# Patient Record
Sex: Male | Born: 1990
Health system: Southern US, Community
[De-identification: ages and names within clinical notes are randomized; demographics above are authoritative.]

## PROBLEM LIST (undated history)

## (undated) DIAGNOSIS — K219 Gastro-esophageal reflux disease without esophagitis: Secondary | ICD-10-CM

## (undated) DIAGNOSIS — T7840XA Allergy, unspecified, initial encounter: Secondary | ICD-10-CM

## (undated) DIAGNOSIS — J45909 Unspecified asthma, uncomplicated: Secondary | ICD-10-CM

## (undated) DIAGNOSIS — M1990 Unspecified osteoarthritis, unspecified site: Secondary | ICD-10-CM

## (undated) HISTORY — DX: Unspecified asthma, uncomplicated: J45.909

## (undated) HISTORY — DX: Unspecified osteoarthritis, unspecified site: M19.90

## (undated) HISTORY — PX: NO PAST SURGERIES: SHX2092

## (undated) HISTORY — DX: Allergy, unspecified, initial encounter: T78.40XA

## (undated) HISTORY — PX: COLONOSCOPY: SHX174

## (undated) HISTORY — DX: Gastro-esophageal reflux disease without esophagitis: K21.9

---
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
## 2011-12-10 ENCOUNTER — Ambulatory Visit: Payer: Self-pay

## 2014-03-26 ENCOUNTER — Ambulatory Visit: Payer: Self-pay | Admitting: Family Medicine

## 2014-06-14 LAB — BASIC METABOLIC PANEL
BUN: 17 mg/dL (ref 4–21)
Creatinine: 1 mg/dL (ref 0.6–1.3)
Glucose: 87 mg/dL
Potassium: 4.4 mmol/L (ref 3.4–5.3)
Sodium: 138 mmol/L (ref 137–147)

## 2014-06-14 LAB — HEPATIC FUNCTION PANEL
ALT: 18 U/L (ref 10–40)
AST: 21 U/L (ref 14–40)
Alkaline Phosphatase: 75 U/L (ref 25–125)

## 2014-06-14 LAB — TSH: TSH: 0.92 u[IU]/mL (ref 0.41–5.90)

## 2014-06-14 LAB — CBC AND DIFFERENTIAL
HCT: 42 % (ref 41–53)
Hemoglobin: 15.2 g/dL (ref 13.5–17.5)
Neutrophils Absolute: 4 /uL
Platelets: 267 10*3/uL (ref 150–399)
WBC: 7.1 10^3/mL

## 2015-03-06 ENCOUNTER — Ambulatory Visit
Admit: 2015-03-06 | Disposition: A | Discharge status: Home / Self Care | Payer: Self-pay | Attending: Family Medicine | Admitting: Family Medicine

## 2015-07-22 ENCOUNTER — Ambulatory Visit: Payer: Self-pay | Admitting: Family Medicine

## 2015-07-22 DIAGNOSIS — J45909 Unspecified asthma, uncomplicated: Secondary | ICD-10-CM | POA: Insufficient documentation

## 2015-07-22 DIAGNOSIS — G43909 Migraine, unspecified, not intractable, without status migrainosus: Secondary | ICD-10-CM

## 2015-07-22 DIAGNOSIS — F4323 Adjustment disorder with mixed anxiety and depressed mood: Secondary | ICD-10-CM | POA: Insufficient documentation

## 2015-07-22 DIAGNOSIS — Z789 Other specified health status: Secondary | ICD-10-CM | POA: Insufficient documentation

## 2015-07-22 HISTORY — DX: Migraine, unspecified, not intractable, without status migrainosus: G43.909

## 2015-07-22 HISTORY — DX: Adjustment disorder with mixed anxiety and depressed mood: F43.23

## 2015-07-22 HISTORY — DX: Unspecified asthma, uncomplicated: J45.909

## 2015-11-12 IMAGING — CR DG FOOT COMPLETE 3+V*L*
1 series · 3 of 3 positions shown · non-contrast
Comparison: None.

CLINICAL DATA: Left foot pain and tenderness. Nodule at base of
fifth metatarsal.

EXAM:
LEFT FOOT - COMPLETE 3+ VIEW

[Series 1: kdxr foot lt comp w/obliques · 0.14mm/px · 3 of 3 slices shown]
[im 1/3]
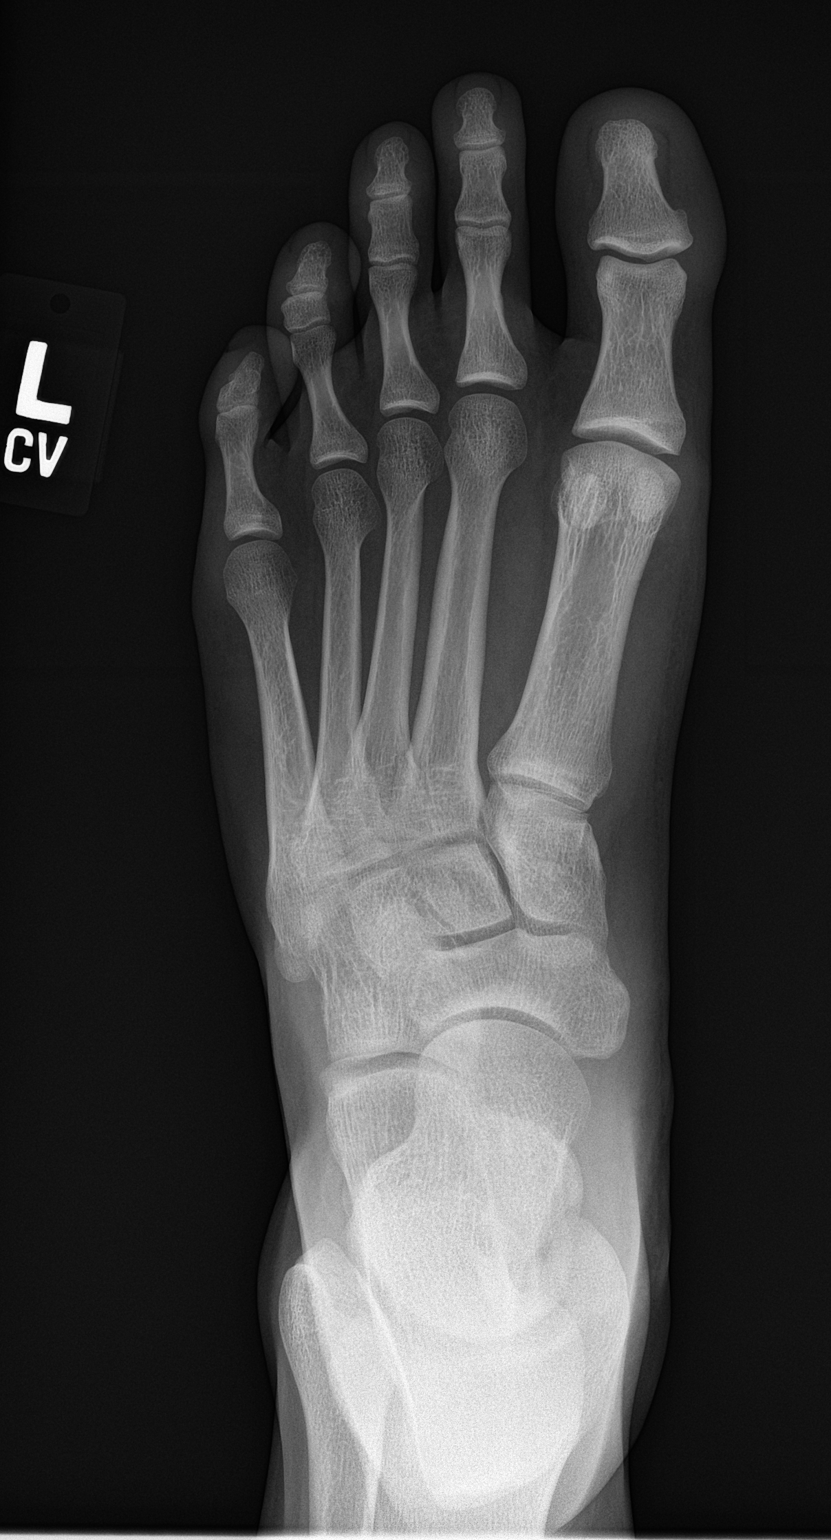
[im 2/3]
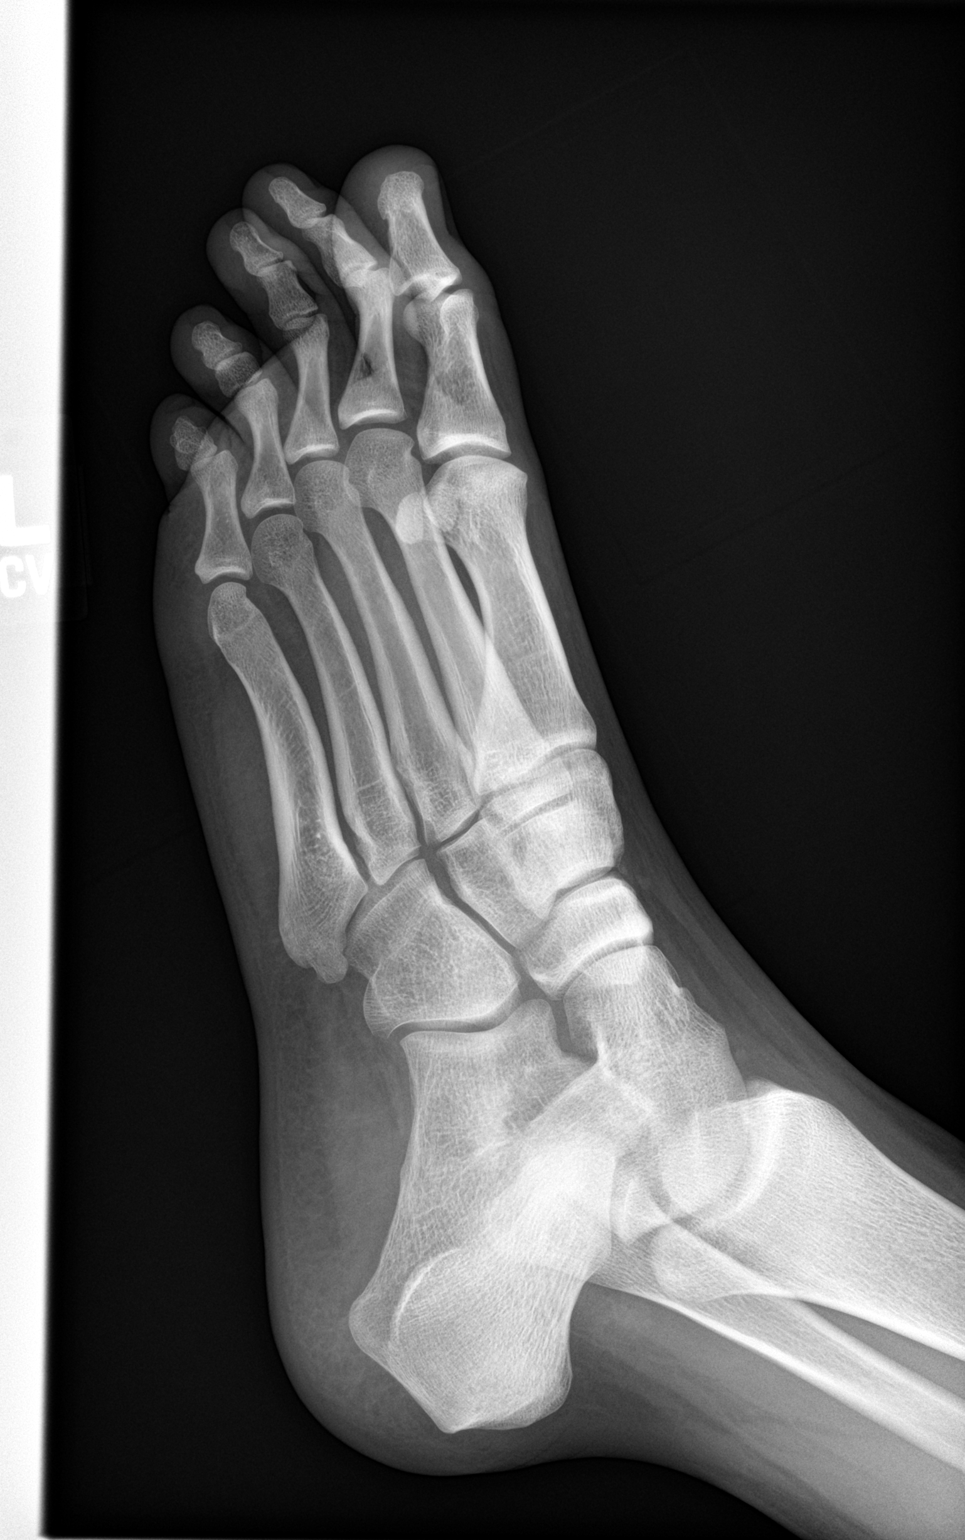
[im 3/3]
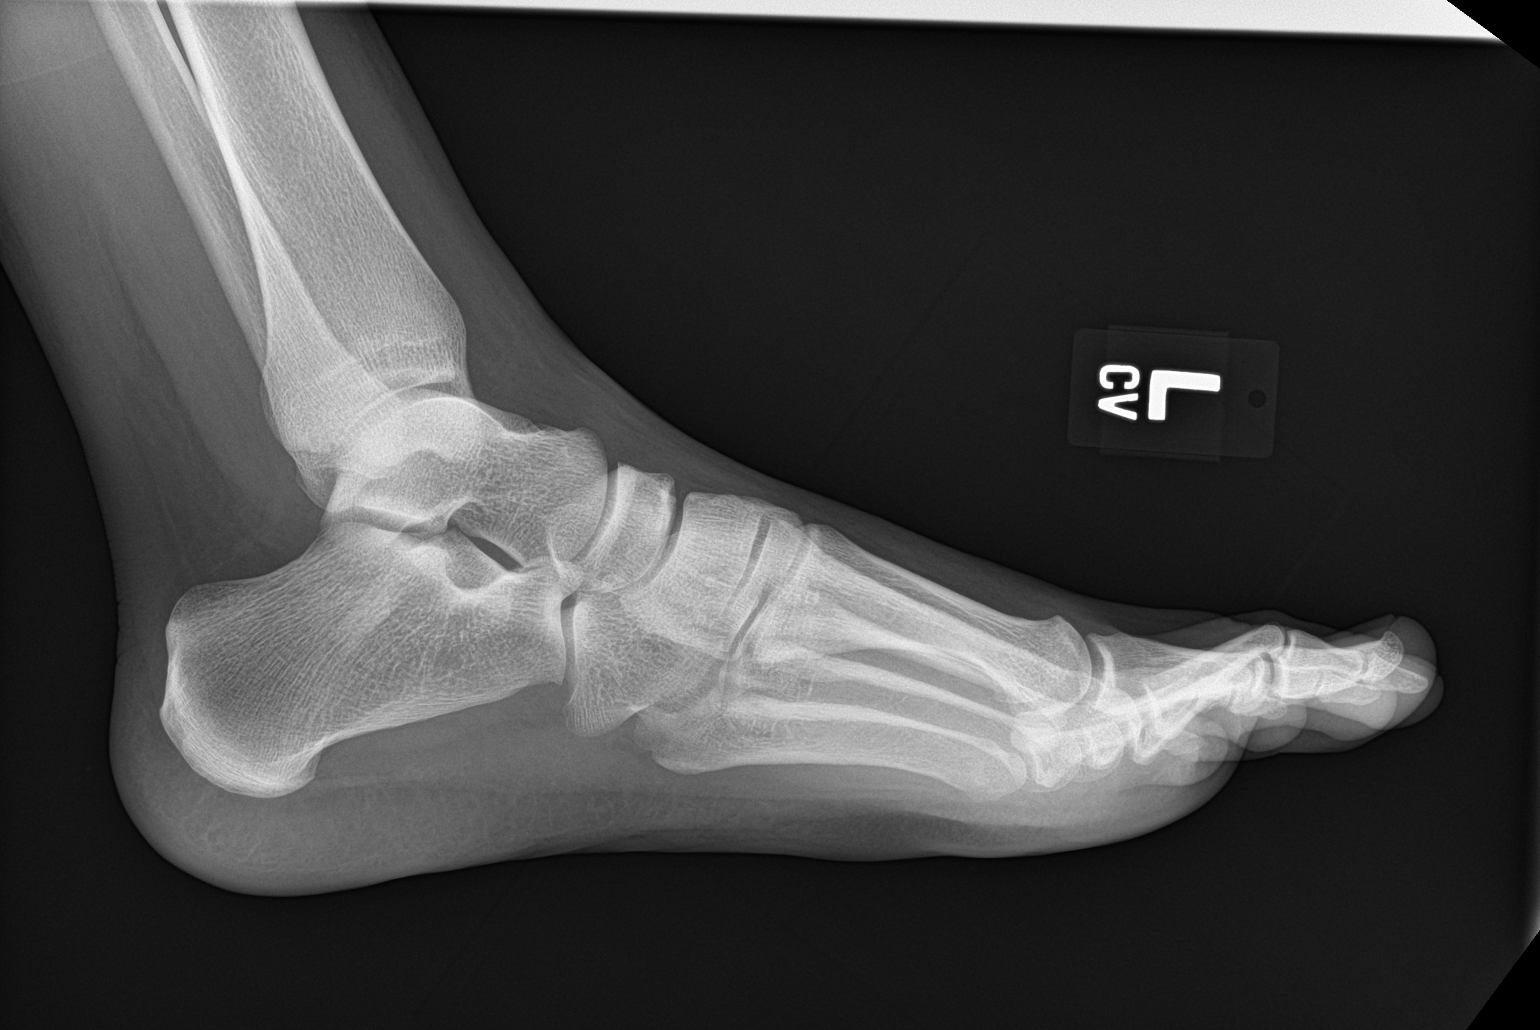

[3 of 3 positions shown; findings below may reference images not displayed]

FINDINGS: No acute bony or joint abnormality identified. No evidence of
fracture dislocation. Bony density noted along the base of the left
fifth metatarsal is most likely incompletely fused secondary
ossification center. If symptoms persist follow-up imaging in 7-10
days can be obtained.
IMPRESSION: Bony density noted at the base of the left fifth metatarsal is most
likely a partially fused secondary ossification center. No other
focal abnormality identified. If symptoms persist follow-up imaging
in 7-10 days can be obtained.

## 2015-12-05 ENCOUNTER — Ambulatory Visit: Payer: Self-pay | Admitting: Family Medicine

## 2017-03-30 ENCOUNTER — Ambulatory Visit (INDEPENDENT_AMBULATORY_CARE_PROVIDER_SITE_OTHER): Payer: 59 | Admitting: Family Medicine

## 2017-03-30 ENCOUNTER — Encounter: Payer: Self-pay | Admitting: Family Medicine

## 2017-03-30 VITALS — BP 110/64 | HR 75 | Temp 98.6°F | Resp 16 | Wt 199.2 lb

## 2017-03-30 DIAGNOSIS — J301 Allergic rhinitis due to pollen: Secondary | ICD-10-CM | POA: Diagnosis not present

## 2017-03-30 MED ORDER — ALBUTEROL SULFATE HFA 108 (90 BASE) MCG/ACT IN AERS
2.0000 | INHALATION_SPRAY | Freq: Four times a day (QID) | RESPIRATORY_TRACT | 0 refills | Status: DC | PRN
Start: 2017-03-30 — End: 2018-04-25

## 2017-03-30 MED ORDER — FLUTICASONE PROPIONATE 50 MCG/ACT NA SUSP: 2.0000 | Freq: Every day | NASAL | 6 refills | Status: DC

## 2017-03-30 NOTE — Patient Instructions (Signed)
Discussed use of Claritin or Zyrtec D.

## 2017-03-30 NOTE — Progress Notes (Signed)
Subjective:     Patient ID: Steven Park, male   DOB: [birth year], 26 y.o.   MRN: [id number]  HPI  Chief Complaint  Patient presents with  . Cough    Patient comes in office today with complaints of cough for the past 3-4days. Patient complains of the following symptoms associated: sore throat, ear pain, shortness of breath on exertion, wheezing and decreased energy. Patient has been taking otc Zyrtec with no relief.   States he does have seasonal allergies and works outside for [employer]. Has childhood history of asthma.   Review of Systems     Objective:   Physical Exam  Constitutional: He appears well-developed and well-nourished. No distress.  Ears: T.M's intact without inflammation Throat: no tonsillar enlargement or exudate, frequent throat clearing noted. Neck: no cervical adenopathy Lungs: clear     Assessment:    1. Seasonal allergic rhinitis due to pollen - fluticasone (FLONASE) 50 MCG/ACT nasal spray; Place 2 sprays into both nostrils daily.  Dispense: 16 g; Refill: 6 - albuterol (PROVENTIL HFA;VENTOLIN HFA) 108 (90 Base) MCG/ACT inhaler; Inhale 2 puffs into the lungs every 6 (six) hours as needed for wheezing or shortness of breath.  Dispense: 1 Inhaler; Refill: 0    Plan:    Discussed use of Claritin D or Zyrtec D.

## 2017-04-01 ENCOUNTER — Telehealth: Payer: Self-pay | Admitting: Family Medicine

## 2017-04-01 NOTE — Telephone Encounter (Signed)
Sounds more like an upper respiratory infection now. Would add Mucinex for chest congestion, Delsym for cough, and schedule the albuterol at least twice daily. The cough will probably last another week. Antibiotics don't help with these viral infections. He also has the option of coming in for another office visit today or tomorrow.

## 2017-04-01 NOTE — Telephone Encounter (Signed)
Patient has been advised. KW 

## 2017-04-01 NOTE — Telephone Encounter (Signed)
Spoke with patient, he saw you on 03/30/17. Overall feels worse. He is using Flonase daily, uses Albuterol inhaler in the morning and has been taking Zyrtec twice daily. He is also took Tylenol and has been drinking hot green tea. The past 2 nights felt like he was running a fever but did not check it, he felt cold/chilled and was under blankets and then last night broke out in sweat and feels like the fever broke at that time. He is also having chest tightness with deep breaths, cough is dry sometimes and sometimes coughing up yellow.greenish phlegm up. He is restless at night. Feels exhausted/tired at night time, some post nasal drip. Feels more in the chest. Sore throat. He is not having any head congestion or sinus pressure or issues with that. Please review. Thank you-aa

## 2017-04-01 NOTE — Telephone Encounter (Signed)
Pt was in this week with Steven Park.  He has sinus and coughing issues.  Sore throat, burning in chest.   He thinks he needs an antibiotic to get better.  Some fever.  [facility],   Pt's call back [phone]  Thanks Steven Park

## 2017-04-03 DIAGNOSIS — M79672 Pain in left foot: Secondary | ICD-10-CM | POA: Diagnosis not present

## 2017-10-17 ENCOUNTER — Ambulatory Visit (INDEPENDENT_AMBULATORY_CARE_PROVIDER_SITE_OTHER): Payer: 59 | Admitting: Adult Health

## 2017-10-17 ENCOUNTER — Encounter: Payer: Self-pay | Admitting: Adult Health

## 2017-10-17 ENCOUNTER — Ambulatory Visit: Payer: Self-pay | Admitting: Adult Health

## 2017-10-17 VITALS — BP 100/64 | HR 103 | Ht 74.0 in | Wt 211.2 lb

## 2017-10-17 DIAGNOSIS — Z0000 Encounter for general adult medical examination without abnormal findings: Secondary | ICD-10-CM

## 2017-10-17 DIAGNOSIS — Z23 Encounter for immunization: Secondary | ICD-10-CM

## 2017-10-17 DIAGNOSIS — H9202 Otalgia, left ear: Secondary | ICD-10-CM

## 2017-10-17 HISTORY — DX: Otalgia, left ear: H92.02

## 2017-10-17 NOTE — Progress Notes (Signed)
Subjective:    Patient ID: Steven Park, male    DOB: [birth year], 26 y.o.   MRN: [id number]  HPI:  [name] is here to establish as a new pt. He is a very pleasant 26 year old male.  PMH: Seasonal allergies, asthma,and tinnitus.  He reports only needing his inhaler 1-2 times/year and feels that seasonal allergies occur with climate change.   He reports dull L ear ache that developed 2 days ago with intermittent tinnitus.  He denies fever/night sweats/palpitations/N/V/Park.  He has gained >10 lbs since the birth of his last child in Sept 2018.  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  He drinks >100 oz water/day and avoids tobacco/ETOH He is married with two children, ages 473 and 2 months. He denies insomnia or current depression/anxiety  Patient Care Team    Relationship Specialty Notifications Start End  Steven Fusianford, Raymone Pembroke D, NP PCP - General Family Medicine  09/16/17     Patient Active Problem List   Diagnosis Date Noted  . Adjustment disorder with mixed anxiety and depressed mood 07/22/2015  . Airway hyperreactivity 07/22/2015  . Migraine 07/22/2015     Past Medical History:  Diagnosis Date  . Allergy   . Asthma      History reviewed. No pertinent surgical history.   Family History  Problem Relation Age of Onset  . Arthritis Mother   . Diabetes Mother   . Hypertension Maternal Grandfather      Social History   Substance and Sexual Activity  Drug Use No     Social History   Substance and Sexual Activity  Alcohol Use Yes  . Alcohol/week: 3.6 oz  . Types: 6 Cans of beer per week     Social History   Tobacco Use  Smoking Status Never Smoker  Smokeless Tobacco Former NeurosurgeonUser  . Types: Chew     Outpatient Encounter Medications as of 10/17/2017  Medication Sig Note  . acetaminophen (TYLENOL) 325 MG tablet Take by mouth. 07/22/2015: Medication taken as needed.  [facility] from[facility]  [facility]  . albuterol (PROVENTIL HFA;VENTOLIN HFA) 108 (90 Base) MCG/ACT inhaler Inhale 2 puffs into the lungs every 6 (six) hours as needed for wheezing or shortness of breath.   . [DISCONTINUED] fluticasone (FLONASE) 50 MCG/ACT nasal spray Place 2 sprays into both nostrils daily.    No facility-administered encounter medications on file as of 10/17/2017.     Allergies: Aspirin  Body mass index is 27.12 kg/m.  Blood pressure 100/64, pulse (!) 103, height 6\' 2"  (1.88 m), weight 211 lb 3.2 oz (95.8 kg).     Review of Systems  Constitutional: Positive for fatigue. Negative for activity change, appetite change, chills, diaphoresis, fever and unexpected weight change.  HENT: Positive for ear pain and postnasal drip. Negative for congestion, sinus pressure, sinus pain and sore throat.   Eyes: Negative for visual disturbance.  Respiratory: Negative for cough, chest tightness, shortness of breath, wheezing and stridor.   Cardiovascular: Negative for chest pain, palpitations and leg swelling.  Gastrointestinal: Negative for abdominal distention, abdominal pain, blood in stool, constipation, diarrhea, nausea and vomiting.  Genitourinary: Negative for difficulty urinating, flank pain and hematuria.  Musculoskeletal: Negative for arthralgias, back pain, gait problem, joint swelling, myalgias, neck pain and neck stiffness.  Skin: Negative for color change, pallor, rash and wound.  Neurological: Negative for dizziness and headaches.  Hematological: Does not bruise/bleed easily.  Psychiatric/Behavioral: Negative  for dysphoric mood, self-injury, sleep disturbance and suicidal ideas. The patient is not nervous/anxious and is not hyperactive.        Objective:   Physical Exam  Constitutional: He is oriented to person, place, and time. He appears well-developed and well-nourished. No distress.  HENT:  Head: Normocephalic and atraumatic.  Right Ear: Hearing, external ear and  ear canal normal. Tympanic membrane is bulging. Tympanic membrane is not erythematous. No decreased hearing is noted.  Left Ear: Hearing, external ear and ear canal normal. Tympanic membrane is bulging. Tympanic membrane is not erythematous. No decreased hearing is noted.  Nose: Mucosal edema and rhinorrhea present. Right sinus exhibits no maxillary sinus tenderness and no frontal sinus tenderness. Left sinus exhibits no maxillary sinus tenderness and no frontal sinus tenderness.  Mouth/Throat: Uvula is midline and mucous membranes are normal. Posterior oropharyngeal erythema present. No posterior oropharyngeal edema.  Eyes: Conjunctivae are normal. Pupils are equal, round, and reactive to light.  Neck: Normal range of motion. Neck supple.  Cardiovascular: Normal rate, regular rhythm, normal heart sounds and intact distal pulses.  No murmur heard. Pulmonary/Chest: Effort normal and breath sounds normal. No respiratory distress. He has no wheezes. He has no rales. He exhibits no tenderness.  Lymphadenopathy:    He has no cervical adenopathy.  Neurological: He is alert and oriented to person, place, and time.  Skin: Skin is warm and Park. No rash noted. He is not diaphoretic. No erythema. No pallor.  Psychiatric: He has a normal mood and affect. His behavior is normal. Judgment and thought content normal.  Nursing note and vitals reviewed.         Assessment & Plan:   1. Need for Tdap vaccination   2. Healthcare maintenance   3. Left ear pain     Healthcare maintenance Follow heart healthy diet and continue excellent water intake. Increase regular exercise.  Recommend at least 30 minutes daily, 5 days per week of walking, jogging, biking, swimming, YouTube/Pinterest workout videos. Please schedule annual physical with full set of labs.  Left ear pain Please use OTC antihistamine and rx flonase as directed. If symptoms persist for >7 days, then please call clinic.    FOLLOW-UP:   Return in about 1 year (around 10/17/2018) for CPE.

## 2017-10-17 NOTE — Assessment & Plan Note (Signed)
Follow heart healthy diet and continue excellent water intake. Increase regular exercise.  Recommend at least 30 minutes daily, 5 days per week of walking, jogging, biking, swimming, YouTube/Pinterest workout videos. Please schedule annual physical with full set of labs.

## 2017-10-17 NOTE — Assessment & Plan Note (Signed)
Please use OTC antihistamine and rx flonase as directed. If symptoms persist for >7 days, then please call clinic.

## 2017-10-17 NOTE — Patient Instructions (Signed)
Heart-Healthy Eating Plan Many factors influence your heart health, including eating and exercise habits. Heart (coronary) risk increases with abnormal blood fat (lipid) levels. Heart-healthy meal planning includes limiting unhealthy fats, increasing healthy fats, and making other small dietary changes. This includes maintaining a healthy body weight to help keep lipid levels within a normal range. What is my plan? Your health care provider recommends that you:  Get no more than __25___% of the total calories in your daily diet from fat.  Limit your intake of saturated fat to less than ___5__% of your total calories each day.  Limit the amount of cholesterol in your diet to less than __300___ mg per day.  What types of fat should I choose?  Choose healthy fats more often. Choose monounsaturated and polyunsaturated fats, such as olive oil and canola oil, flaxseeds, walnuts, almonds, and seeds.  Eat more omega-3 fats. Good choices include salmon, mackerel, sardines, tuna, flaxseed oil, and ground flaxseeds. Aim to eat fish at least two times each week.  Limit saturated fats. Saturated fats are primarily found in animal products, such as meats, butter, and cream. Plant sources of saturated fats include palm oil, palm kernel oil, and coconut oil.  Avoid foods with partially hydrogenated oils in them. These contain trans fats. Examples of foods that contain trans fats are stick margarine, some tub margarines, cookies, crackers, and other baked goods. What general guidelines do I need to follow?  Check food labels carefully to identify foods with trans fats or high amounts of saturated fat.  Fill one half of your plate with vegetables and green salads. Eat 4-5 servings of vegetables per day. A serving of vegetables equals 1 cup of raw leafy vegetables,  cup of raw or cooked cut-up vegetables, or  cup of vegetable juice.  Fill one fourth of your plate with whole grains. Look for the word "whole"  as the first word in the ingredient list.  Fill one fourth of your plate with lean protein foods.  Eat 4-5 servings of fruit per day. A serving of fruit equals one medium whole fruit,  cup of dried fruit,  cup of fresh, frozen, or canned fruit, or  cup of 100% fruit juice.  Eat more foods that contain soluble fiber. Examples of foods that contain this type of fiber are apples, broccoli, carrots, beans, peas, and barley. Aim to get 20-30 g of fiber per day.  Eat more home-cooked food and less restaurant, buffet, and fast food.  Limit or avoid alcohol.  Limit foods that are high in starch and sugar.  Avoid fried foods.  Cook foods by using methods other than frying. Baking, boiling, grilling, and broiling are all great options. Other fat-reducing suggestions include: ? Removing the skin from poultry. ? Removing all visible fats from meats. ? Skimming the fat off of stews, soups, and gravies before serving them. ? Steaming vegetables in water or broth.  Lose weight if you are overweight. Losing just 5-10% of your initial body weight can help your overall health and prevent diseases such as diabetes and heart disease.  Increase your consumption of nuts, legumes, and seeds to 4-5 servings per week. One serving of dried beans or legumes equals  cup after being cooked, one serving of nuts equals 1 ounces, and one serving of seeds equals  ounce or 1 tablespoon.  You may need to monitor your salt (sodium) intake, especially if you have high blood pressure. Talk with your health care provider or dietitian to get  more information about reducing sodium. What foods can I eat? Grains  Breads, including [place], white, pita, wheat, raisin, rye, oatmeal, and [place]. Tortillas that are neither fried nor made with lard or trans fat. Low-fat rolls, including hotdog and hamburger buns and English muffins. Biscuits. Muffins. Waffles. Pancakes. Light popcorn. Whole-grain cereals. Flatbread. Melba  toast. Pretzels. Breadsticks. Rusks. Low-fat snacks and crackers, including oyster, saltine, matzo, graham, animal, and rye. Rice and pasta, including brown rice and those that are made with whole wheat. Vegetables All vegetables. Fruits All fruits, but limit coconut. Meats and Other Protein Sources Lean, well-trimmed beef, veal, pork, and lamb. Chicken and [place] without skin. All fish and shellfish. Wild duck, rabbit, pheasant, and venison. Egg whites or low-cholesterol egg substitutes. Dried beans, peas, lentils, and tofu.Seeds and most nuts. Dairy Low-fat or nonfat cheeses, including ricotta, string, and mozzarella. Skim or 1% milk that is liquid, powdered, or evaporated. Buttermilk that is made with low-fat milk. Nonfat or low-fat yogurt. Beverages Mineral water. Diet carbonated beverages. Sweets and Desserts Sherbets and fruit ices. Honey, jam, marmalade, jelly, and syrups. Meringues and gelatins. Pure sugar candy, such as hard candy, jelly beans, gumdrops, mints, marshmallows, and small amounts of dark chocolate. [employer] Eat all sweets and desserts in moderation. Fats and Oils Nonhydrogenated (trans-free) margarines. Vegetable oils, including soybean, sesame, sunflower, olive, peanut, safflower, corn, canola, and cottonseed. Salad dressings or mayonnaise that are made with a vegetable oil. Limit added fats and oils that you use for cooking, baking, salads, and as spreads. Other Cocoa powder. Coffee and tea. All seasonings and condiments. The items listed above may not be a complete list of recommended foods or beverages. Contact your dietitian for more options. What foods are not recommended? Grains Breads that are made with saturated or trans fats, oils, or whole milk. Croissants. Butter rolls. Cheese breads. Sweet rolls. Donuts. Buttered popcorn. Chow mein noodles. High-fat crackers, such as cheese or butter crackers. Meats and Other Protein Sources Fatty meats, such as  hotdogs, short ribs, sausage, spareribs, bacon, ribeye roast or steak, and mutton. High-fat deli meats, such as salami and bologna. Caviar. Domestic duck and goose. Organ meats, such as kidney, liver, sweetbreads, brains, gizzard, chitterlings, and heart. Dairy Cream, sour cream, cream cheese, and creamed cottage cheese. Whole milk cheeses, including blue (bleu), Monterey Jack, [place], [place], American, [place], Swiss, [place], [place], and [place]. Whole or 2% milk that is liquid, evaporated, or condensed. Whole buttermilk. Cream sauce or high-fat cheese sauce. Yogurt that is made from whole milk. Beverages Regular sodas and drinks with added sugar. Sweets and Desserts Frosting. Pudding. Cookies. Cakes other than angel food cake. Candy that has milk chocolate or white chocolate, hydrogenated fat, butter, coconut, or unknown ingredients. Buttered syrups. Full-fat ice cream or ice cream drinks. Fats and Oils Gravy that has suet, meat fat, or shortening. Cocoa butter, hydrogenated oils, palm oil, coconut oil, palm kernel oil. These can often be found in baked products, candy, fried foods, nondairy creamers, and whipped toppings. Solid fats and shortenings, including bacon fat, salt pork, lard, and butter. Nondairy cream substitutes, such as coffee creamers and sour cream substitutes. Salad dressings that are made of unknown oils, cheese, or sour cream. The items listed above may not be a complete list of foods and beverages to avoid. Contact your dietitian for more information. This information is not intended to replace advice given to you by your health care provider. Make sure you discuss any questions you have with your health care  provider. Document Released: 08/24/2008 Document Revised: 06/04/2016 Document Reviewed: 05/09/2014 Elsevier Interactive Patient Education  2017 [employer]   Earache, Adult An earache, or ear pain, can be caused by many things, including:  An infection.  Ear wax  buildup.  Ear pressure.  Something in the ear that should not be there (foreign body).  A sore throat.  Tooth problems.  Jaw problems.  Treatment of the earache will depend on the cause. If the cause is not clear or cannot be determined, you may need to watch your symptoms until your earache goes away or until a cause is found. Follow these instructions at home: Pay attention to any changes in your symptoms. Take these actions to help with your pain:  Take or apply over-the-counter and prescription medicines only as told by your health care provider.  If you were prescribed an antibiotic medicine, use it as told by your health care provider. Do not stop using the antibiotic even if you start to feel better.  Do not put anything in your ear other than medicine that is prescribed by your health care provider.  If directed, apply heat to the affected area as often as told by your health care provider. Use the heat source that your health care provider recommends, such as a moist heat pack or a heating pad. ? Place a towel between your skin and the heat source. ? Leave the heat on for 20-30 minutes. ? Remove the heat if your skin turns bright red. This is especially important if you are unable to feel pain, heat, or cold. You may have a greater risk of getting burned.  If directed, put ice on the ear: ? Put ice in a plastic bag. ? Place a towel between your skin and the bag. ? Leave the ice on for 20 minutes, 2-3 times a day.  Try resting in an upright position instead of lying down. This may help to reduce pressure in your ear and relieve pain.  Chew gum if it helps to relieve your ear pain.  Treat any allergies as told by your health care provider.  Keep all follow-up visits as told by your health care provider. This is important.  Contact a health care provider if:  Your pain does not improve within 2 days.  Your earache gets worse.  You have new symptoms.  You have a  fever. Get help right away if:  You have a severe headache.  You have a stiff neck.  You have trouble swallowing.  You have redness or swelling behind your ear.  You have fluid or blood coming from your ear.  You have hearing loss.  You feel dizzy. This information is not intended to replace advice given to you by your health care provider. Make sure you discuss any questions you have with your health care provider. Document Released: 07/02/2004 Document Revised: 07/13/2016 Document Reviewed: 05/10/2016 Elsevier Interactive Patient Education  2018 [employer]   Follow heart healthy diet and continue excellent water intake. Please use OTC antihistamine and rx flonase as directed. If symptoms persist for >7 days, then please call clinic. Increase regular exercise.  Recommend at least 30 minutes daily, 5 days per week of walking, jogging, biking, swimming, YouTube/Pinterest workout videos. Please schedule annual physical with full set of labs. WELCOME TO THE PRACTICE!

## 2017-11-24 ENCOUNTER — Ambulatory Visit: Payer: 59 | Admitting: Adult Health

## 2018-04-25 ENCOUNTER — Other Ambulatory Visit: Payer: Self-pay | Admitting: Adult Health

## 2018-04-25 DIAGNOSIS — J301 Allergic rhinitis due to pollen: Secondary | ICD-10-CM

## 2018-04-25 MED ORDER — ALBUTEROL SULFATE HFA 108 (90 BASE) MCG/ACT IN AERS: 2.0000 | INHALATION_SPRAY | Freq: Four times a day (QID) | RESPIRATORY_TRACT | 6 refills | Status: DC | PRN

## 2018-05-30 ENCOUNTER — Encounter: Payer: Self-pay | Admitting: Family Medicine

## 2018-05-30 ENCOUNTER — Ambulatory Visit (INDEPENDENT_AMBULATORY_CARE_PROVIDER_SITE_OTHER): Payer: 59 | Admitting: Family Medicine

## 2018-05-30 VITALS — BP 126/56 | HR 66 | Ht 74.0 in | Wt 216.0 lb

## 2018-05-30 DIAGNOSIS — R195 Other fecal abnormalities: Secondary | ICD-10-CM

## 2018-05-30 DIAGNOSIS — K625 Hemorrhage of anus and rectum: Secondary | ICD-10-CM

## 2018-05-30 DIAGNOSIS — K648 Other hemorrhoids: Secondary | ICD-10-CM

## 2018-05-30 HISTORY — DX: Other fecal abnormalities: R19.5

## 2018-05-30 HISTORY — DX: Other hemorrhoids: K64.8

## 2018-05-30 HISTORY — DX: Hemorrhage of anus and rectum: K62.5

## 2018-05-30 MED ORDER — PRAMOXINE HCL 1 % RE FOAM: 1.0000 "application " | Freq: Three times a day (TID) | RECTAL | 1 refills | Status: DC | PRN

## 2018-05-30 NOTE — Progress Notes (Signed)
Pt here for an acute care OV today   Impression and Recommendations:    1. Internal hemorrhoid, bleeding; nonthrombosed   2. Gross bleeding per rectum   3. Loose stools      ANOSCOPY Indication: Bleeding per Rectum Consent:  Discussed benefits and risks of procedure and verbal consent obtained Procedure:   Patient was prepped and laid in the right lateral recumbent position for the procedure.  Visual inspection showed no external hemorrhoids, fissures, rash, or erythema/swelling.  Utilized an anoscope to assess and take note of the rectal canal.  Gross blood appreciated around the 10-11 o'clock position with internal hemorrhoid approximately 2 sonometer in length, non-thrombosed, and no extreme tenderness to palpation or pressure. EBL: Less than 0.5 cc. Post-Procedural Instructions:    Patient tolerated procedure well.  Discussed routine rectal care and bowel movement hygiene.  If stools loose, add bulking agents such as fiber and/or psyllium, adequate hydration stressed, and use Miralax PRN for constipation.  1. Blood in Stool - due to Internal Hemorrhoids - Cream prescribed today for hemorrhoids.  - Advised patient to make sure to hydrate well, at least half of his body weight in ounces of water per day.  - Do NOT strain to go to the bathroom.  Educated patient that straining during bowel movements causes hemorrhoids to emerge.  - Handout provided today for patient's education on hemorrhoid care and prevention.  - If patient is feeling like his stools are incredibly runny, encouraged to use bulking agents like psyllium husks or fiber supplements.  If constipated, he may use Miralax for gentle relief.  - If patient begins to feel dizzy, lightheaded, and bleeding continues in the rectum, we may need to assess for anemia.  - Advised patient that if his GI symptoms continue and bowels do not normalize, he should return to talk to clinic about visiting GI for colonoscopy.  2.  Follow-Up - Even if symptoms resolve, encouraged patient to return in 6 months for routine care, to touch base.   Meds ordered this encounter  Medications  . pramoxine (PROCTOFOAM) 1 % foam    Sig: Place 1 application rectally 3 (three) times daily as needed for anal itching.    Dispense:  15 g    Refill:  1     Education and routine counseling performed. Handouts provided  Gross side effects, risk and benefits, and alternatives of medications and treatment plan in general discussed with patient.  Patient is aware that all medications have potential side effects and we are unable to predict every side effect or drug-drug interaction that may occur.   Patient will call with any questions prior to using medication if they have concerns.  Expresses verbal understanding and consents to current therapy and treatment regimen.  No barriers to understanding were identified.  Red flag symptoms and signs discussed in detail.  Patient expressed understanding regarding what to do in case of emergency\urgent symptoms   Please see AVS handed out to patient at the end of our visit for further patient instructions/ counseling done pertaining to today's office visit.   Return if symptoms worsen or fail to improve, for Also follow-up 6 months or routine care.     Note: This document was prepared occasionally using Dragon voice recognition software and may include unintentional dictation errors in addition to a scribe.  This document serves as a record of services personally performed by Thomasene Lot, DO. It was created on her behalf by Peggye Fothergill, a trained  medical scribe. The creation of this record is based on the scribe's personal observations and the provider's statements to them.   I have reviewed the above medical documentation for accuracy and completeness and I concur.  Thomasene Lot 05/31/18 1:03  PM   -----------------------------------------------------------------------------------------------------------   Subjective:    CC:  Chief Complaint  Patient presents with  . Rectal Bleeding    HPI: Steven Park is a 27 y.o. male who presents to [facility] today for issues as discussed below.  Acute Blood in Stool Patient hasn't had a solid stool in 5-6 weeks.  Hasn't been runny, notes that it's "just been like soft serve ice cream."  Patient noted nothing about frequency.  Around two weeks ago, started noticing almost like "tomato peel mixed in with it or something."  Thursday evening, about 5 days ago, he went to wipe and there was quite a bit of blood.  He presents a picture of this during his appointment today.  Notes that his anus does itch sometimes.  This itching has been going on for a while.  Denies pain or cramping; has never had anything like this occur before.  Patient has never previously had issues with his stools or GI that he knows of.  Notes that he did have a spell back in December or January when his stools were backed up for some reason.  For 3-4 days, he didn't go at all; kept taking laxatives, and finally it got back to normal.  "I don't know what happened."  Family History Denies family history of colon cancers or rectal cancers, remarking "not that I'm aware of."    States that he doesn't really know his dad's side of the family, so it's just "not that he's aware of."  Overall Health Patient states that he believes he's mildly healthy, "I could be better."  Tobacco Use Quit chewing tobacco in 2017.  Has smoked occasionally in the past, but nothing significant.  Alcohol Use Patient notes that his alcohol consumption depends on the day. He usually doesn't drink during the week; notes that drinking is "not an every-day event." For example, states he may drink a 12 pack one weekend, and might not drink anything the  next.  Recent Family Events Had his daughter September of last year; got 6 weeks off to be with her.  Over the first 2 weeks, he continued regularly going to the gym, but quit after that and hasn't gone since.   General Health Patient notes that he used to work out at the gym often, does not anymore.     Wt Readings from Last 3 Encounters:  05/30/18 216 lb (98 kg)  10/17/17 211 lb 3.2 oz (95.8 kg)  03/30/17 199 lb 3.2 oz (90.4 kg)   BP Readings from Last 3 Encounters:  05/30/18 (!) 126/56  10/17/17 100/64  03/30/17 110/64   BMI Readings from Last 3 Encounters:  05/30/18 27.73 kg/m  10/17/17 27.12 kg/m     Patient Care Team    Relationship Specialty Notifications Start End  Julaine Fusi, NP PCP - General Family Medicine  09/16/17      Patient Active Problem List   Diagnosis Date Noted  . Internal hemorrhoid, bleeding; nonthrombosed 05/30/2018  . Rectal bleeding 05/30/2018  . Loose stools 05/30/2018  . Healthcare maintenance 10/17/2017  . Left ear pain 10/17/2017  . Adjustment disorder with mixed anxiety and depressed mood 07/22/2015  . Airway hyperreactivity 07/22/2015  .  Migraine 07/22/2015    Past Medical history, Surgical history, Family history, Social history, Allergies and Medications have been entered into the medical record, reviewed and changed as needed.    Current Meds  Medication Sig  . cetirizine (ZYRTEC) 10 MG tablet Take 10 mg by mouth daily.    Allergies:  Allergies  Allergen Reactions  . Aspirin Swelling     Review of Systems: General:   Denies fever, chills, unexplained weight loss.  Optho/Auditory:   Denies visual changes, blurred vision/LOV Respiratory:   Denies wheeze, DOE more than baseline levels.  Cardiovascular:   Denies chest pain, palpitations, new onset peripheral edema  Gastrointestinal:   Denies nausea, vomiting, diarrhea, abd pain.  Genitourinary: Denies dysuria, freq/ urgency, flank pain or discharge from genitals.   Endocrine:     Denies hot or cold intolerance, polyuria, polydipsia. Musculoskeletal:   Denies unexplained myalgias, joint swelling, unexplained arthralgias, gait problems.  Skin:  Denies new onset rash, suspicious lesions Neurological:     Denies dizziness, unexplained weakness, numbness  Psychiatric/Behavioral:   Denies mood changes, suicidal or homicidal ideations, hallucinations    Objective:   Blood pressure (!) 126/56, pulse 66, height 6\' 2"  (1.88 m), weight 216 lb (98 kg), SpO2 98 %. Body mass index is 27.73 kg/m. General:  Well Developed, well nourished, appropriate for stated age.  Neuro:  Alert and oriented,  extra-ocular muscles intact  HEENT:  Normocephalic, atraumatic, neck supple Skin:  no gross rash, warm, pink. Cardiac:  RRR, S1 S2 Respiratory:  ECTA B/L and A/P, Not using accessory muscles, speaking in full sentences- unlabored. Vascular:  Ext warm, no cyanosis apprec.; cap RF less 2 sec. Psych:  No HI/SI, judgement and insight good, Euthymic mood. Full Affect. Rectal Exam:   Exterior completely normal, no rash, no external hemorrhoids, nothing unusual. Bleeding Internal hemorrhoid, non-thrombosed.

## 2018-05-30 NOTE — Patient Instructions (Signed)
Hemorrhoids Hemorrhoids are swollen veins in and around the rectum or anus. There are two types of hemorrhoids:  Internal hemorrhoids. These occur in the veins that are just inside the rectum. They may poke through to the outside and become irritated and painful.  External hemorrhoids. These occur in the veins that are outside of the anus and can be felt as a painful swelling or hard lump near the anus.  Most hemorrhoids do not cause serious problems, and they can be managed with home treatments such as diet and lifestyle changes. If home treatments do not help your symptoms, procedures can be done to shrink or remove the hemorrhoids. What are the causes? This condition is caused by increased pressure in the anal area. This pressure may result from various things, including:  Constipation.  Straining to have a bowel movement.  Diarrhea.  Pregnancy.  Obesity.  Sitting for long periods of time.  Heavy lifting or other activity that causes you to strain.  Anal sex.  What are the signs or symptoms? Symptoms of this condition include:  Pain.  Anal itching or irritation.  Rectal bleeding.  Leakage of stool (feces).  Anal swelling.  One or more lumps around the anus.  How is this diagnosed? This condition can often be diagnosed through a visual exam. Other exams or tests may also be done, such as:  Examination of the rectal area with a gloved hand (digital rectal exam).  Examination of the anal canal using a small tube (anoscope).  A blood test, if you have lost a significant amount of blood.  A test to look inside the colon (sigmoidoscopy or colonoscopy).  How is this treated? This condition can usually be treated at home. However, various procedures may be done if dietary changes, lifestyle changes, and other home treatments do not help your symptoms. These procedures can help make the hemorrhoids smaller or remove them completely. Some of these procedures  involve surgery, and others do not. Common procedures include:  Rubber band ligation. Rubber bands are placed at the base of the hemorrhoids to cut off the blood supply to them.  Sclerotherapy. Medicine is injected into the hemorrhoids to shrink them.  Infrared coagulation. A type of light energy is used to get rid of the hemorrhoids.  Hemorrhoidectomy surgery. The hemorrhoids are surgically removed, and the veins that supply them are tied off.  Stapled hemorrhoidopexy surgery. A circular stapling device is used to remove the hemorrhoids and use staples to cut off the blood supply to them.  Follow these instructions at home: Eating and drinking  Eat foods that have a lot of fiber in them, such as whole grains, beans, nuts, fruits, and vegetables. Ask your health care provider about taking products that have added fiber (fiber supplements).  Drink enough fluid to keep your urine clear or pale yellow. Managing pain and swelling  Take warm sitz baths for 20 minutes, 3-4 times a day to ease pain and discomfort.  If directed, apply ice to the affected area. Using ice packs between sitz baths may be helpful. ? Put ice in a plastic bag. ? Place a towel between your skin and the bag. ? Leave the ice on for 20 minutes, 2-3 times a day. General instructions  Take over-the-counter and prescription medicines only as told by your health care provider.  Use medicated creams or suppositories as told.  Exercise regularly.  Go to the bathroom when you have the urge to have a bowel movement.  Do not wait.  Avoid straining to have bowel movements.  Keep the anal area dry and clean. Use wet toilet paper or moist towelettes after a bowel movement.  Do not sit on the toilet for long periods of time. This increases blood pooling and pain. Contact a health care provider if:  You have increasing pain and swelling that are not controlled by treatment or medicine.  You have uncontrolled  bleeding.  You have difficulty having a bowel movement, or you are unable to have a bowel movement.  You have pain or inflammation outside the area of the hemorrhoids. This information is not intended to replace advice given to you by your health care provider. Make sure you discuss any questions you have with your health care provider. Document Released: 11/12/2000 Document Revised: 04/14/2016 Document Reviewed: 07/30/2015 Elsevier Interactive Patient Education  2018 [employer]. About Hemorrhoids  Hemorrhoids are swollen veins in the lower rectum and anus.  Also called piles, hemorrhoids are a common problem.  Hemorrhoids may be internal (inside the rectum) or external (around the anus).  Internal Hemorrhoids  Internal hemorrhoids are often painless, but they rarely cause bleeding.  The internal veins may stretch and fall down (prolapse) through the anus to the outside of the body.  The veins may then become irritated and painful.  External Hemorrhoids  External hemorrhoids can be easily seen or felt around the anal opening.  They are under the skin around the anus.  When the swollen veins are scratched or broken by straining, rubbing or wiping they sometimes bleed.  How Hemorrhoids Occur  Veins in the rectum and around the anus tend to swell under pressure.  Hemorrhoids can result from increased pressure in the veins of your anus or rectum.  Some sources of pressure are:   Straining to have a bowel movement because of constipation  Waiting too long to have a bowel movement  Coughing and sneezing often  Sitting for extended periods of time, including on the toilet  Diarrhea  Obesity  Trauma or injury to the anus  Some liver diseases  Stress  Family history of hemorrhoids  Pregnancy  Pregnant women should try to avoid becoming constipated, because they are more likely to have hemorrhoids during pregnancy.  In the last trimester of pregnancy, the enlarged uterus may  press on blood vessels and causes hemorrhoids.  In addition, the strain of childbirth sometimes causes hemorrhoids after the birth.  Symptoms of Hemorrhoids  Some symptoms of hemorrhoids include:  Swelling and/or a tender lump around the anus  Itching, mild burning and bleeding around the anus  Painful bowel movements with or without constipation  Bright red blood covering the stool, on toilet paper or in the toilet bowel.   Symptoms usually go away within a few days.  Always talk to your doctor about any bleeding to make sure it is not from some other causes.  Diagnosing and Treating Hemorrhoids  Diagnosis is made by an examination by your healthcare provider.  Special test can be performed by your doctor.    Most cases of hemorrhoids can be treated with:  High-fiber diet: Eat more high-fiber foods, which help prevent constipation.  Ask for more detailed fiber information on types and sources of fiber from your healthcare provider.  Fluids: Drink plenty of water.  This helps soften bowel movements so they are easier to pass.  Sitz baths and cold packs: Sitting in lukewarm water two or three times a day for 15 minutes cleases  the anal area and may relieve discomfort.  If the water is too hot, swelling around the anus will get worse.  Placing a cloth-covered ice pack on the anus for ten minutes four times a day can also help reduce selling.  Gently pushing a prolapsed hemorrhoid back inside after the bath or ice pack can be helpful.  Medications: For mild discomfort, your healthcare provider may suggest over-the-counter pain medication or prescribe a cream or ointment for topical use.  The cream may contain witch hazel, zinc oxide or petroleum jelly.  Medicated suppositories are also a treatment option.  Always consult your doctor before applying medications or creams.  Procedures and surgeries: There are also a number of procedures and surgeries to shrink or remove hemorrhoids in more  serious cases.  Talk to your physician about these options.  You can often prevent hemorrhoids or keep them from becoming worse by maintaining a healthy lifestyle.  Eat a fiber-rich diet of fruits, vegetables and whole grains.  Also, drink plenty of water and exercise regularly.   2007, [employer] Doc.30

## 2018-09-19 ENCOUNTER — Ambulatory Visit (INDEPENDENT_AMBULATORY_CARE_PROVIDER_SITE_OTHER): Payer: 59 | Admitting: Adult Health

## 2018-09-19 ENCOUNTER — Encounter: Payer: Self-pay | Admitting: Adult Health

## 2018-09-19 VITALS — BP 115/68 | HR 85 | Ht 74.0 in | Wt 212.2 lb

## 2018-09-19 DIAGNOSIS — R300 Dysuria: Secondary | ICD-10-CM

## 2018-09-19 HISTORY — DX: Dysuria: R30.0

## 2018-09-19 NOTE — Patient Instructions (Signed)
Dysuria Dysuria is pain or discomfort while urinating. The pain or discomfort may be felt in the tube that carries urine out of the bladder (urethra) or in the surrounding tissue of the genitals. The pain may also be felt in the groin area, lower abdomen, and lower back. You may have to urinate frequently or have the sudden feeling that you have to urinate (urgency). Dysuria can affect both men and women, but is more common in women. Dysuria can be caused by many different things, including:  Urinary tract infection in women.  Infection of the kidney or bladder.  Kidney stones or bladder stones.  Certain sexually transmitted infections (STIs), such as chlamydia.  Dehydration.  Inflammation of the vagina.  Use of certain medicines.  Use of certain soaps or scented products that cause irritation.  Follow these instructions at home: Watch your dysuria for any changes. The following actions may help to reduce any discomfort you are feeling:  Drink enough fluid to keep your urine clear or pale yellow.  Empty your bladder often. Avoid holding urine for long periods of time.  After a bowel movement or urination, women should cleanse from front to back, using each tissue only once.  Empty your bladder after sexual intercourse.  Take medicines only as directed by your health care provider.  If you were prescribed an antibiotic medicine, finish it all even if you start to feel better.  Avoid caffeine, tea, and alcohol. They can irritate the bladder and make dysuria worse. In men, alcohol may irritate the prostate.  Keep all follow-up visits as directed by your health care provider. This is important.  If you had any tests done to find the cause of dysuria, it is your responsibility to obtain your test results. Ask the lab or department performing the test when and how you will get your results. Talk with your health care provider if you have any questions about your results.  Contact a  health care provider if:  You develop pain in your back or sides.  You have a fever.  You have nausea or vomiting.  You have blood in your urine.  You are not urinating as often as you usually do. Get help right away if:  You pain is severe and not relieved with medicines.  You are unable to hold down any fluids.  You or someone else notices a change in your mental function.  You have a rapid heartbeat at rest.  You have shaking or chills.  You feel extremely weak. This information is not intended to replace advice given to you by your health care provider. Make sure you discuss any questions you have with your health care provider. Document Released: 08/13/2004 Document Revised: 04/22/2016 Document Reviewed: 07/11/2014 Elsevier Interactive Patient Education  2018 [employer].  Increase water. We will call you when urine test results are available. If symptoms occur again, please call clinic.

## 2018-09-19 NOTE — Progress Notes (Signed)
Subjective:    Patient ID: Steven Park, male    DOB: 07/04/1991, 27 y.o.   MRN: [id number]  HPI:  Mr. Perales presents with dysuria and "spot on my penis" He reports sexual intercourse with is wife 09/15/18/4 days ago and first noticed redness on top of penis shaft. He denies penile drainage or pain. He has been married for 9 years and denies known infidelity in the relationship. He denies groin pain or groin lymphedema. He denies hx of STI He denies his wife with hx of STIs He denies fever/night sweats/chills/malaise/poor appetite   Patient Care Team    Relationship Specialty Notifications Start End  Monisha Siebel, Jinny Blossom, NP PCP - General Family Medicine  09/16/17     Patient Active Problem List   Diagnosis Date Noted  . Dysuria 09/19/2018  . Internal hemorrhoid, bleeding; nonthrombosed 05/30/2018  . Rectal bleeding 05/30/2018  . Loose stools 05/30/2018  . Healthcare maintenance 10/17/2017  . Left ear pain 10/17/2017  . Adjustment disorder with mixed anxiety and depressed mood 07/22/2015  . Airway hyperreactivity 07/22/2015  . Migraine 07/22/2015     Past Medical History:  Diagnosis Date  . Allergy   . Asthma      History reviewed. No pertinent surgical history.   Family History  Problem Relation Age of Onset  . Arthritis Mother   . Diabetes Mother   . Hypertension Maternal Grandfather      Social History   Substance and Sexual Activity  Drug Use No     Social History   Substance and Sexual Activity  Alcohol Use Yes  . Alcohol/week: 6.0 standard drinks  . Types: 6 Cans of beer per week     Social History   Tobacco Use  Smoking Status Never Smoker  Smokeless Tobacco Former Neurosurgeon  . Types: Chew     Outpatient Encounter Medications as of 09/19/2018  Medication Sig  . cetirizine (ZYRTEC) 10 MG tablet Take 10 mg by mouth daily.  . citalopram (CELEXA) 20 MG tablet Take 20 mg by mouth daily.  . [DISCONTINUED] pramoxine (PROCTOFOAM) 1 % foam  Place 1 application rectally 3 (three) times daily as needed for anal itching.   No facility-administered encounter medications on file as of 09/19/2018.     Allergies: Aspirin  Body mass index is 27.24 kg/m.  Blood pressure 115/68, pulse 85, height 6\' 2"  (1.88 m), weight 212 lb 3.2 oz (96.3 kg), SpO2 98 %.  Review of Systems  Constitutional: Positive for fatigue. Negative for activity change, appetite change, chills, diaphoresis, fever and unexpected weight change.  Respiratory: Negative for cough, chest tightness, shortness of breath, wheezing and stridor.   Cardiovascular: Negative for chest pain, palpitations and leg swelling.  Gastrointestinal: Negative for abdominal distention, anal bleeding, blood in stool, constipation, diarrhea, nausea and vomiting.  Genitourinary: Positive for dysuria and genital sores. Negative for decreased urine volume, difficulty urinating, discharge, flank pain, frequency, hematuria, penile pain, penile swelling, scrotal swelling, testicular pain and urgency.  Musculoskeletal: Negative for back pain.  Neurological: Negative for dizziness and headaches.  Hematological: Does not bruise/bleed easily.      Objective:   Physical Exam  Constitutional: He appears well-developed and well-nourished. He appears distressed.  HENT:  Head: Normocephalic and atraumatic.  Right Ear: External ear normal.  Left Ear: External ear normal.  Nose: Nose normal.  Mouth/Throat: Oropharynx is clear and moist.  Cardiovascular: Normal rate, regular rhythm, normal heart sounds and intact distal pulses.  No murmur heard. Pulmonary/Chest: Effort  normal and breath sounds normal. No stridor. No respiratory distress. He has no wheezes. He has no rales. He exhibits no tenderness.  Abdominal: Soft. Bowel sounds are normal. He exhibits no distension and no mass. There is no tenderness. There is no rebound and no guarding. No hernia.  Genitourinary: Prostate normal. No penile  tenderness.  Genitourinary Comments: Chaperone present during examination. One small red spot noted on left lateral shaft of penis. No fluid filled lesions noted No drainage noted No masses, nodules in testicles   Skin: Skin is warm and dry. Capillary refill takes less than 2 seconds. No rash noted. He is not diaphoretic. No erythema. No pallor.  Psychiatric: He has a normal mood and affect. His behavior is normal. Judgment and thought content normal.      Assessment & Plan:   1. Dysuria     Dysuria UA/STI Screening sent off Physical examination-normal Increase water. We will call you when urine test results are available. If symptoms occur again, please call clinic.    FOLLOW-UP:  Return if symptoms worsen or fail to improve.

## 2018-09-19 NOTE — Assessment & Plan Note (Addendum)
UA/STI Screening sent off Physical examination-normal Increase water. We will call you when urine test results are available. If symptoms occur again, please call clinic.

## 2018-09-21 LAB — URINALYSIS, ROUTINE W REFLEX MICROSCOPIC
Bilirubin, UA: NEGATIVE
Glucose, UA: NEGATIVE
Ketones, UA: NEGATIVE
Leukocytes, UA: NEGATIVE
Nitrite, UA: NEGATIVE
PH UA: 7 (ref 5.0–7.5)
Protein, UA: NEGATIVE
RBC, UA: NEGATIVE
Specific Gravity, UA: 1.011 (ref 1.005–1.030)
Urobilinogen, Ur: 0.2 mg/dL (ref 0.2–1.0)

## 2018-09-21 LAB — GC/CHLAMYDIA PROBE AMP
Chlamydia trachomatis, NAA: NEGATIVE
Neisseria gonorrhoeae by PCR: NEGATIVE

## 2018-10-17 NOTE — Progress Notes (Signed)
Subjective:    Patient ID: Steven Park, male    DOB: 01-05-1991, 27 y.o.   MRN: [id number]  HPI :  Steven Park is here for CPE He has two complaints- 1) Change in bowel habits since June 2019- Loose stools that are the consistency of "soft serve ice cream". Normal BM habits- once day, now he has 1-2 BMs day He reports abd pain prior to [facility] He has BRB r/t internal hemorrhoid July 2019, no hematochezia since then. He denies family hx of colon ca, however he is unaware of his father's side of family medical hx He has lost 7 lbs since last OV, r/t reduction in caloric intake He denies fever, however has been experiencing night sweats several times a week >3 months 2) Laceration on penis r/t to rough oral sex He denies penile discharge, groin pain  He stopped taking Citalopram due to "difficulty concentrating"  Fasting labs obtained today  Healthcare Maintenance: Immunizations-UTD  Patient Care Team    Relationship Specialty Notifications Start End  Julaine Fusi, NP PCP - General Family Medicine  09/16/17     Patient Active Problem List   Diagnosis Date Noted  . Dysuria 09/19/2018  . Internal hemorrhoid, bleeding; nonthrombosed 05/30/2018  . Rectal bleeding 05/30/2018  . Loose stools 05/30/2018  . Healthcare maintenance 10/17/2017  . Left ear pain 10/17/2017  . Adjustment disorder with mixed anxiety and depressed mood 07/22/2015  . Airway hyperreactivity 07/22/2015  . Migraine 07/22/2015     Past Medical History:  Diagnosis Date  . Allergy   . Asthma      History reviewed. No pertinent surgical history.   Family History  Problem Relation Age of Onset  . Arthritis Mother   . Diabetes Mother   . Hypertension Maternal Grandfather      Social History   Substance and Sexual Activity  Drug Use No     Social History   Substance and Sexual Activity  Alcohol Use Yes  . Alcohol/week: 6.0 standard drinks  . Types: 6 Cans of beer per week      Social History   Tobacco Use  Smoking Status Never Smoker  Smokeless Tobacco Former Neurosurgeon  . Types: Chew     Outpatient Encounter Medications as of 10/19/2018  Medication Sig  . cetirizine (ZYRTEC) 10 MG tablet Take 10 mg by mouth daily.  . [DISCONTINUED] citalopram (CELEXA) 20 MG tablet Take 20 mg by mouth daily.   No facility-administered encounter medications on file as of 10/19/2018.     Allergies: Aspirin and Citalopram  Body mass index is 26.71 kg/m.  Blood pressure 110/70, pulse 69, height 6' 1.5" (1.867 m), weight 205 lb 3.2 oz (93.1 kg), SpO2 98 %. Review of Systems  Constitutional: Positive for fatigue. Negative for activity change, appetite change, chills, diaphoresis, fever and unexpected weight change.  HENT: Negative for congestion.   Eyes: Negative for visual disturbance.  Respiratory: Negative for cough, chest tightness, shortness of breath, wheezing and stridor.   Cardiovascular: Negative for chest pain, palpitations and leg swelling.  Gastrointestinal: Positive for abdominal pain and diarrhea. Negative for abdominal distention, blood in stool, constipation, nausea and vomiting.  Genitourinary: Positive for penile pain. Negative for difficulty urinating, discharge, flank pain, penile swelling, scrotal swelling and testicular pain.  Musculoskeletal: Negative for arthralgias and gait problem.  Skin: Negative for color change, pallor, rash and wound.  Neurological: Negative for dizziness and headaches.  Hematological: Does not bruise/bleed easily.  Psychiatric/Behavioral: Negative for behavioral problems,  confusion, decreased concentration, dysphoric mood, hallucinations, self-injury, sleep disturbance and suicidal ideas. The patient is not nervous/anxious and is not hyperactive.        Objective:   Physical Exam  Constitutional: He is oriented to person, place, and time. He appears well-developed and well-nourished. No distress.  HENT:  Head:  Normocephalic and atraumatic.  Right Ear: External ear normal. Tympanic membrane is not erythematous and not bulging. No decreased hearing is noted.  Left Ear: External ear normal. Tympanic membrane is not erythematous and not bulging. No decreased hearing is noted.  Nose: Nose normal. No mucosal edema. Right sinus exhibits no maxillary sinus tenderness and no frontal sinus tenderness. Left sinus exhibits no maxillary sinus tenderness and no frontal sinus tenderness.  Mouth/Throat: Uvula is midline, oropharynx is clear and moist and mucous membranes are normal. Tonsils are 0 on the right. Tonsils are 0 on the left. No tonsillar exudate.  Eyes: Pupils are equal, round, and reactive to light. Conjunctivae and EOM are normal.  Neck: Normal range of motion. Neck supple.  Cardiovascular: Normal rate, regular rhythm, normal heart sounds and intact distal pulses.  No murmur heard. Pulmonary/Chest: Effort normal and breath sounds normal. No stridor. No respiratory distress. He has no wheezes. He has no rales. He exhibits no tenderness.  Abdominal: Soft. Bowel sounds are normal. He exhibits no distension and no mass. There is no tenderness. There is no rebound and no guarding. No hernia.  Genitourinary: Penis normal. Circumcised. No penile tenderness.     Genitourinary Comments: 0.02 linear laceration noted on penis, no open tissue/drainage noted  Chaperone present during examination.  Musculoskeletal: Normal range of motion. He exhibits no edema or tenderness.  Lymphadenopathy:    He has no cervical adenopathy.  Neurological: He is alert and oriented to person, place, and time. Coordination normal.  Skin: Skin is warm and dry. Capillary refill takes less than 2 seconds. No rash noted. He is not diaphoretic. No erythema. No pallor.  Psychiatric: He has a normal mood and affect. His behavior is normal. Judgment and thought content normal.  Nursing note and vitals reviewed.     Assessment & Plan:    1. Screening for HIV (human immunodeficiency virus)   2. Need for Tdap vaccination   3. Loose stools   4. Healthcare maintenance     Healthcare maintenance Increase water intake, strive for at least 100ounces/day.   Follow Heart Healthy diet Increase regular exercise.  Recommend at least 30 minutes daily, 5 days per week of walking, jogging, biking, swimming, YouTube/Pinterest workout videos. Referral to Gastroenterologist placed to address change in bowel habits. We will call you when lab results are available. Follow-up: annual physical with fasting labs.  Loose stools Loose stools that are the consistency of "soft serve ice cream". Normal BM habits- once day, now he has 1-2 BMs day [facility] He has [facility] r/t internal hemorrhoid July 2019, no hematochezia since then. He denies family hx of colon ca, however he is unaware of his father's side of family medical hx He has lost 7 lbs since last OV, r/t reduction in caloric intake He denies fever, however has been experiencing night sweats several times a week >3 months    FOLLOW-UP:  Return in about 1 year (around 10/20/2019) for CPE, Fasting Labs.

## 2018-10-18 ENCOUNTER — Other Ambulatory Visit: Payer: Self-pay

## 2018-10-18 DIAGNOSIS — Z0000 Encounter for general adult medical examination without abnormal findings: Secondary | ICD-10-CM

## 2018-10-19 ENCOUNTER — Ambulatory Visit (INDEPENDENT_AMBULATORY_CARE_PROVIDER_SITE_OTHER): Payer: 59 | Admitting: Adult Health

## 2018-10-19 ENCOUNTER — Encounter: Payer: Self-pay | Admitting: Gastroenterology

## 2018-10-19 ENCOUNTER — Encounter: Payer: Self-pay | Admitting: Adult Health

## 2018-10-19 VITALS — BP 110/70 | HR 69 | Ht 73.5 in | Wt 205.2 lb

## 2018-10-19 DIAGNOSIS — R195 Other fecal abnormalities: Secondary | ICD-10-CM | POA: Diagnosis not present

## 2018-10-19 DIAGNOSIS — Z0000 Encounter for general adult medical examination without abnormal findings: Secondary | ICD-10-CM

## 2018-10-19 DIAGNOSIS — Z23 Encounter for immunization: Secondary | ICD-10-CM | POA: Diagnosis not present

## 2018-10-19 DIAGNOSIS — Z114 Encounter for screening for human immunodeficiency virus [HIV]: Secondary | ICD-10-CM | POA: Diagnosis not present

## 2018-10-19 NOTE — Assessment & Plan Note (Signed)
Increase water intake, strive for at least 100ounces/day.   Follow Heart Healthy diet Increase regular exercise.  Recommend at least 30 minutes daily, 5 days per week of walking, jogging, biking, swimming, YouTube/Pinterest workout videos. Referral to Gastroenterologist placed to address change in bowel habits. We will call you when lab results are available. Follow-up: annual physical with fasting labs.

## 2018-10-19 NOTE — Assessment & Plan Note (Signed)
Loose stools that are the consistency of "soft serve ice cream". Normal BM habits- once day, now he has 1-2 BMs day [facility] He has [facility]B r/t internal hemorrhoid July 2019, no hematochezia since then. He denies family hx of colon ca, however he is unaware of his father's side of family medical hx He has lost 7 lbs since last OV, r/t reduction in caloric intake He denies fever, however has been experiencing night sweats several times a week >3 months

## 2018-10-19 NOTE — Patient Instructions (Addendum)

## 2018-10-20 LAB — CBC WITH DIFFERENTIAL/PLATELET
BASOS ABS: 0.1 10*3/uL (ref 0.0–0.2)
Basos: 1 %
EOS (ABSOLUTE): 0.3 10*3/uL (ref 0.0–0.4)
Eos: 5 %
HEMOGLOBIN: 14.9 g/dL (ref 13.0–17.7)
Hematocrit: 42.4 % (ref 37.5–51.0)
Immature Grans (Abs): 0.1 10*3/uL (ref 0.0–0.1)
Immature Granulocytes: 1 %
LYMPHS ABS: 1.7 10*3/uL (ref 0.7–3.1)
Lymphs: 27 %
MCH: 30.6 pg (ref 26.6–33.0)
MCHC: 35.1 g/dL (ref 31.5–35.7)
MCV: 87 fL (ref 79–97)
MONOCYTES: 9 %
Monocytes Absolute: 0.6 10*3/uL (ref 0.1–0.9)
NEUTROS ABS: 3.6 10*3/uL (ref 1.4–7.0)
Neutrophils: 57 %
Platelets: 259 10*3/uL (ref 150–450)
RBC: 4.87 x10E6/uL (ref 4.14–5.80)
RDW: 12.3 % (ref 12.3–15.4)
WBC: 6.2 10*3/uL (ref 3.4–10.8)

## 2018-10-20 LAB — COMPREHENSIVE METABOLIC PANEL
A/G RATIO: 2 (ref 1.2–2.2)
ALBUMIN: 5.1 g/dL (ref 3.5–5.5)
ALT: 11 IU/L (ref 0–44)
AST: 17 IU/L (ref 0–40)
Alkaline Phosphatase: 76 IU/L (ref 39–117)
BILIRUBIN TOTAL: 0.7 mg/dL (ref 0.0–1.2)
BUN/Creatinine Ratio: 16 (ref 9–20)
BUN: 14 mg/dL (ref 6–20)
CHLORIDE: 100 mmol/L (ref 96–106)
CO2: 25 mmol/L (ref 20–29)
Calcium: 10.1 mg/dL (ref 8.7–10.2)
Creatinine, Ser: 0.88 mg/dL (ref 0.76–1.27)
GFR calc non Af Amer: 116 mL/min/{1.73_m2} (ref >59)
GFR, EST AFRICAN AMERICAN: 134 mL/min/{1.73_m2} (ref >59)
GLOBULIN, TOTAL: 2.5 g/dL (ref 1.5–4.5)
Glucose: 95 mg/dL (ref 65–99)
POTASSIUM: 4.5 mmol/L (ref 3.5–5.2)
SODIUM: 139 mmol/L (ref 134–144)
TOTAL PROTEIN: 7.6 g/dL (ref 6.0–8.5)

## 2018-10-20 LAB — HEMOGLOBIN A1C
ESTIMATED AVERAGE GLUCOSE: 105 mg/dL
HEMOGLOBIN A1C: 5.3 % (ref 4.8–5.6)

## 2018-10-20 LAB — HIV ANTIBODY (ROUTINE TESTING W REFLEX): HIV Screen 4th Generation wRfx: NONREACTIVE

## 2018-10-20 LAB — LIPID PANEL
CHOL/HDL RATIO: 2.9 ratio (ref 0.0–5.0)
Cholesterol, Total: 137 mg/dL (ref 100–199)
HDL: 48 mg/dL (ref >39)
LDL Calculated: 77 mg/dL (ref 0–99)
Triglycerides: 58 mg/dL (ref 0–149)
VLDL CHOLESTEROL CAL: 12 mg/dL (ref 5–40)

## 2018-10-20 LAB — TSH: TSH: 0.738 u[IU]/mL (ref 0.450–4.500)

## 2018-11-13 ENCOUNTER — Encounter: Payer: Self-pay | Admitting: Gastroenterology

## 2018-11-14 ENCOUNTER — Ambulatory Visit: Payer: 59 | Admitting: Gastroenterology

## 2018-12-25 ENCOUNTER — Ambulatory Visit: Payer: 59 | Admitting: Gastroenterology

## 2019-01-16 ENCOUNTER — Ambulatory Visit: Payer: 59 | Admitting: Gastroenterology

## 2019-01-30 ENCOUNTER — Encounter: Payer: Self-pay | Admitting: Gastroenterology

## 2019-01-30 ENCOUNTER — Ambulatory Visit (INDEPENDENT_AMBULATORY_CARE_PROVIDER_SITE_OTHER): Payer: 59 | Admitting: Gastroenterology

## 2019-01-30 ENCOUNTER — Other Ambulatory Visit (INDEPENDENT_AMBULATORY_CARE_PROVIDER_SITE_OTHER): Payer: 59

## 2019-01-30 VITALS — BP 110/70 | HR 76 | Ht 74.0 in | Wt 207.0 lb

## 2019-01-30 DIAGNOSIS — R197 Diarrhea, unspecified: Secondary | ICD-10-CM | POA: Diagnosis not present

## 2019-01-30 LAB — HIGH SENSITIVITY CRP: CRP HIGH SENSITIVITY: 0.86 mg/L (ref 0.000–5.000)

## 2019-01-30 LAB — IGA: IgA: 329 mg/dL (ref 68–378)

## 2019-01-30 LAB — SEDIMENTATION RATE: SED RATE: 7 mm/h (ref 0–15)

## 2019-01-30 NOTE — Progress Notes (Signed)
HPI: This is a very pleasant 28 year old man who was referred to me by Steven Hamburger D, NP  to evaluate chronic loose stools, intermittent rectal bleeding.    Chief complaint is chronic loose stools, intermittent rectal bleeding  Last May, 2019 he had significant overt red rectal bleeding.  This was during 1 day.  Since then he has had fairly loose stools.  This was definitely the beginning of a change in his bowel habits.  Prior to the bleeding last May he would have a solid formed stool once daily.  Since then he has had fairly mushy stools, once a day or once every other day.  Rarely keeps formed.  No significant abdominal pains.  He has noted minor blood streaks on the loose stools periodically but never more than once a month or so it sounds like.  No significant abdominal pains.  No fevers or chills.  Weight stable.  Family history negative for colon cancer Crohn's disease or colitis  Takes tylenol 2-4 pills per day.  ASA allergy, swelling  Old Data Reviewed:  Blood work November 2019 shows normal CBC, normal complete metabolic profile, normal TSH, HIV negative.    Review of systems: Pertinent positive and negative review of systems were noted in the above HPI section. All other review negative.   Past Medical History:  Diagnosis Date  . Allergy   . Asthma     Past Surgical History:  Procedure Laterality Date  . NO PAST SURGERIES      Current Outpatient Medications  Medication Sig Dispense Refill  . cetirizine (ZYRTEC) 10 MG tablet Take 10 mg by mouth daily as needed.      No current facility-administered medications for this visit.     Allergies as of 01/30/2019 - Review Complete 01/30/2019  Allergen Reaction Noted  . Aspirin Swelling 07/22/2015  . Citalopram Other (See Comments) 10/19/2018    Family History  Problem Relation Age of Onset  . Arthritis Mother   . Diabetes Mother   . Other Father        pt doesn't really talk with him  . Hypertension  Maternal Grandfather     Social History   Socioeconomic History  . Marital status: Married    Spouse name: Not on file  . Number of children: 2  . Years of education: Not on file  . Highest education level: Not on file  Occupational History  . Occupation: [place] [employer] ([employer])  Social Needs  . Financial resource strain: Not on file  . Food insecurity:    Worry: Not on file    Inability: Not on file  . Transportation needs:    Medical: Not on file    Non-medical: Not on file  Tobacco Use  . Smoking status: Never Smoker  . Smokeless tobacco: Former Neurosurgeon    Types: Chew  Substance and Sexual Activity  . Alcohol use: Yes    Alcohol/week: 6.0 standard drinks    Types: 6 Cans of beer per week  . Drug use: No  . Sexual activity: Yes    Birth control/protection: Pill  Lifestyle  . Physical activity:    Days per week: Not on file    Minutes per session: Not on file  . Stress: Not on file  Relationships  . Social connections:    Talks on phone: Not on file    Gets together: Not on file    Attends religious service: Not on file    Active member of  club or organization: Not on file    Attends meetings of clubs or organizations: Not on file    Relationship status: Not on file  . Intimate partner violence:    Fear of current or ex partner: Not on file    Emotionally abused: Not on file    Physically abused: Not on file    Forced sexual activity: Not on file  Other Topics Concern  . Not on file  Social History Narrative  . Not on file     Physical Exam: BP 110/70   Pulse 76   Ht 6\' 2"  (1.88 m)   Wt 207 lb (93.9 kg) Comment: with steel toe boots  BMI 26.58 kg/m  Constitutional: generally well-appearing Psychiatric: alert and oriented x3 Eyes: extraocular movements intact Mouth: oral pharynx moist, no lesions Neck: supple no lymphadenopathy Cardiovascular: heart regular rate and rhythm Lungs: clear to auscultation bilaterally Abdomen: soft, nontender,  nondistended, no obvious ascites, no peritoneal signs, normal bowel sounds Extremities: no lower extremity edema bilaterally Skin: no lesions on visible extremities   Assessment and plan: 28 y.o. male with loose stools, intermittent rectal bleeding chronically  This may be a new diagnosis of inflammatory bowel disease, perhaps irritable bowel syndrome diarrhea predominant.  Possibly chronic infection.  Celiac sprue can certainly cause loosening of the stools.  I recommended we start the work-up with stool testing and blood work, see that summarized in patient instructions.  If these tests do not fully explain his symptoms then he would likely need further testing with a colonoscopy.     Please see the "Patient Instructions" section for addition details about the plan.   Steven Bunting, MD [facility] 01/30/2019, 3:16 PM  Cc: Steven Fusi, NP

## 2019-01-30 NOTE — Patient Instructions (Addendum)
You will have labs checked today in the basement lab.  Please head down after you check out with the front desk  (tTG, total IgA, ESR, CRP, stool GI pathogen panel).  Depending on the results of the above tests you may need further testing with a colonoscopy.

## 2019-01-31 LAB — TISSUE TRANSGLUTAMINASE, IGA: (tTG) Ab, IgA: 1 U/mL

## 2019-02-01 ENCOUNTER — Other Ambulatory Visit: Payer: 59

## 2019-02-01 DIAGNOSIS — R197 Diarrhea, unspecified: Secondary | ICD-10-CM

## 2019-02-05 LAB — GASTROINTESTINAL PATHOGEN PANEL PCR
C. difficile Tox A/B, PCR: NOT DETECTED
CRYPTOSPORIDIUM, PCR: NOT DETECTED
Campylobacter, PCR: NOT DETECTED
E coli (ETEC) LT/ST PCR: NOT DETECTED
E coli (STEC) stx1/stx2, PCR: NOT DETECTED
E coli 0157, PCR: NOT DETECTED
Giardia lamblia, PCR: NOT DETECTED
Norovirus, PCR: NOT DETECTED
ROTAVIRUS, PCR: NOT DETECTED
Salmonella, PCR: NOT DETECTED
Shigella, PCR: NOT DETECTED

## 2019-05-01 ENCOUNTER — Telehealth: Payer: Self-pay | Admitting: Gastroenterology

## 2019-05-01 ENCOUNTER — Telehealth: Payer: Self-pay

## 2019-05-01 ENCOUNTER — Other Ambulatory Visit: Payer: Self-pay | Admitting: Gastroenterology

## 2019-05-01 DIAGNOSIS — R197 Diarrhea, unspecified: Secondary | ICD-10-CM

## 2019-05-01 MED ORDER — PEG 3350-KCL-NA BICARB-NACL 420 G PO SOLR: 4000.0000 mL | ORAL | 0 refills | Status: DC

## 2019-05-01 NOTE — Telephone Encounter (Signed)
Patient called to schedule and appt but would rather have a office visit. Please advise for scheduling.

## 2019-05-01 NOTE — Telephone Encounter (Signed)
Spoke to patient and saw he was due for a colonoscopy back in March per Dr Christella Hartigan stool study recommendations. Scheduled colonoscopy for next week. Reviewed instructions with patient and mailed out forms.

## 2019-05-01 NOTE — Telephone Encounter (Signed)
Patient called to reschedule his appointment to an in person visit due to his symptoms of continued loose stools. Patient will be scheduled for a colonoscopy per Dr Christella Hartigan recommendations on stool study labs. Instructions mailed out to patient. He will call for with any questions.

## 2019-05-07 ENCOUNTER — Telehealth: Payer: Self-pay | Admitting: *Deleted

## 2019-05-07 NOTE — Telephone Encounter (Signed)

## 2019-05-08 ENCOUNTER — Ambulatory Visit (AMBULATORY_SURGERY_CENTER): Payer: 59 | Admitting: Gastroenterology

## 2019-05-08 ENCOUNTER — Encounter: Payer: Self-pay | Admitting: Gastroenterology

## 2019-05-08 ENCOUNTER — Other Ambulatory Visit: Payer: Self-pay

## 2019-05-08 VITALS — BP 97/60 | HR 68 | Temp 98.2°F | Resp 16

## 2019-05-08 DIAGNOSIS — R197 Diarrhea, unspecified: Secondary | ICD-10-CM | POA: Diagnosis present

## 2019-05-08 DIAGNOSIS — K625 Hemorrhage of anus and rectum: Secondary | ICD-10-CM

## 2019-05-08 MED ORDER — SODIUM CHLORIDE 0.9 % IV SOLN: 500.0000 mL | Freq: Once | INTRAVENOUS | Status: DC

## 2019-05-08 NOTE — Progress Notes (Signed)
To PACU< VSS. Report to [email or web address] 

## 2019-05-08 NOTE — Patient Instructions (Signed)
Await pathology results.  Try taking Immodium AD once a day after waking up each morning.  YOU HAD AN ENDOSCOPIC PROCEDURE TODAY AT [facility]:   Refer to the procedure report that was given to you for any specific questions about what was found during the examination.  If the procedure report does not answer your questions, please call your gastroenterologist to clarify.  If you requested that your care partner not be given the details of your procedure findings, then the procedure report has been included in a sealed envelope for you to review at your convenience later.  YOU SHOULD EXPECT: Some feelings of bloating in the abdomen. Passage of more gas than usual.  Walking can help get rid of the air that was put into your GI tract during the procedure and reduce the bloating. If you had a lower endoscopy (such as a colonoscopy or flexible sigmoidoscopy) you may notice spotting of blood in your stool or on the toilet paper. If you underwent a bowel prep for your procedure, you may not have a normal bowel movement for a few days.  Please Note:  You might notice some irritation and congestion in your nose or some drainage.  This is from the oxygen used during your procedure.  There is no need for concern and it should clear up in a day or so.  SYMPTOMS TO REPORT IMMEDIATELY:   Following lower endoscopy (colonoscopy or flexible sigmoidoscopy):  Excessive amounts of blood in the stool  Significant tenderness or worsening of abdominal pains  Swelling of the abdomen that is new, acute  Fever of 100F or higher    For urgent or emergent issues, a gastroenterologist can be reached at any hour by calling [phone].   DIET:  We do recommend a small meal at first, but then you may proceed to your regular diet.  Drink plenty of fluids but you should avoid alcoholic beverages for 24 hours.  ACTIVITY:  You should plan to take it easy for the rest of today and you should NOT DRIVE  or use heavy machinery until tomorrow (because of the sedation medicines used during the test).    FOLLOW UP: Our staff will call the number listed on your records 48-72 hours following your procedure to check on you and address any questions or concerns that you may have regarding the information given to you following your procedure. If we do not reach you, we will leave a message.  We will attempt to reach you two times.  During this call, we will ask if you have developed any symptoms of COVID 19. If you develop any symptoms (ie: fever, flu-like symptoms, shortness of breath, cough etc.) before then, please call [phone].  If you test positive for Covid 19 in the 2 weeks post procedure, please call and report this information to [place].    If any biopsies were taken you will be contacted by phone or by letter within the next 1-3 weeks.  Please call us at [phone] if you have not heard about the biopsies in 3 weeks.    SIGNATURES/CONFIDENTIALITY: You and/or your care partner have signed paperwork which will be entered into your electronic medical record.  These signatures attest to the fact that that the information above on your After Visit Summary has been reviewed and is understood.  Full responsibility of the confidentiality of this discharge information lies with you and/or your care-partner.

## 2019-05-08 NOTE — Progress Notes (Signed)
Called to room to assist during endoscopic procedure.  Patient ID and intended procedure confirmed with present staff. Received instructions for my participation in the procedure from the performing physician.  

## 2019-05-08 NOTE — Op Note (Signed)
[facility] Patient Name: Steven RamusMichael Park Procedure Date: 05/08/2019 2:35 PM MRN: [id number] Endoscopist: Rachael Feeaniel P Diontre Harps , MD Age: 28 Referring MD:  Date of Birth: June 07, 1991 Gender: Male Account #: [id number] Procedure:                Colonoscopy Indications:              Chronic diarrhea Medicines:                Monitored Anesthesia Care Procedure:                Pre-Anesthesia Assessment:                           - Prior to the procedure, a History and Physical                            was performed, and patient medications and                            allergies were reviewed. The patient's tolerance of                            previous anesthesia was also reviewed. The risks                            and benefits of the procedure and the sedation                            options and risks were discussed with the patient.                            All questions were answered, and informed consent                            was obtained. Prior Anticoagulants: The patient has                            taken no previous anticoagulant or antiplatelet                            agents. ASA Grade Assessment: II - A patient with                            mild systemic disease. After reviewing the risks                            and benefits, the patient was deemed in                            satisfactory condition to undergo the procedure.                           After obtaining informed consent, the colonoscope  was passed under direct vision. Throughout the                            procedure, the patient's blood pressure, pulse, and                            oxygen saturations were monitored continuously. The                            Colonoscope was introduced through the anus and                            advanced to the the terminal ileum. The colonoscopy                            was performed without difficulty. The patient                             tolerated the procedure well. The quality of the                            bowel preparation was good. The terminal ileum,                            ileocecal valve, appendiceal orifice, and rectum                            were photographed. Scope In: 2:48:14 PM Scope Out: 2:58:23 PM Scope Withdrawal Time: 0 hours 7 minutes 41 seconds  Total Procedure Duration: 0 hours 10 minutes 9 seconds  Findings:                 The terminal ileum appeared normal.                           The right colon mucosa was normal, biopsies taken                            (jar 1).                           The left colon mucosa was very slightly                            erythematous without a distinct transition to                            normal mucosa, biopsies taken (jar 2).                           The rectum was moderately erthymatous and friable                            without a distinct transition to the left colon  findings, biopsies taken (jar 3).                           Biopsies for histology were taken with a cold                            forceps from the cecum for evaluation of                            microscopic colitis.                           The exam was otherwise without abnormality on                            direct and retroflexion views. Complications:            No immediate complications. Estimated blood loss:                            None. Estimated Blood Loss:     Estimated blood loss: none. Impression:               - The examined portion of the ileum was normal.                           - Possibly very mild left sided colitis (UC).                            Extensively biopsied. Recommendation:           - Patient has a contact number available for                            emergencies. The signs and symptoms of potential                            delayed complications were discussed with the                             patient. Return to normal activities tomorrow.                            Written discharge instructions were provided to the                            patient.                           - Resume previous diet.                           - Await pathology results. For now please try                            taking a single imodium AD once daily shortly after  waking every morning.                           - Continue present medications. Rachael Feeaniel P Aubrionna Istre, MD 05/08/2019 3:04:52 PM This report has been signed electronically.

## 2019-05-08 NOTE — Progress Notes (Signed)
Temp/CW VS/JB

## 2019-05-10 ENCOUNTER — Telehealth: Payer: Self-pay | Admitting: *Deleted

## 2019-05-10 NOTE — Telephone Encounter (Signed)
  Follow up Call-  Call back number 05/08/2019  Post procedure Call Back phone  # [phone]  Permission to leave phone message Yes  Some recent data might be hidden     Patient questions:  Do you have a fever, pain , or abdominal swelling? No. Pain Score  0 *  Have you tolerated food without any problems? Yes.    Have you been able to return to your normal activities? Yes.    Do you have any questions about your discharge instructions: Diet   No. Medications  No. Follow up visit  No.  Do you have questions or concerns about your Care? Yes.    Actions: * If pain score is 4 or above: No action needed, pain <4.  1. Have you developed a fever since your procedure? no  2.   Have you had an respiratory symptoms (SOB or cough) since your procedure? no  3.   Have you tested positive for COVID 19 since your procedure no  4.   Have you had any family members/close contacts diagnosed with the COVID 19 since your procedure?  no   If yes to any of these questions please route to Joylene John, RN and Alphonsa Gin, Therapist, sports.

## 2019-05-14 ENCOUNTER — Encounter: Payer: Self-pay | Admitting: Adult Health

## 2019-05-15 ENCOUNTER — Ambulatory Visit: Payer: 59 | Admitting: Gastroenterology

## 2019-05-16 ENCOUNTER — Encounter: Payer: Self-pay | Admitting: Gastroenterology

## 2019-05-18 ENCOUNTER — Telehealth: Payer: Self-pay | Admitting: Gastroenterology

## 2019-05-18 NOTE — Telephone Encounter (Signed)
Pt requested a call back to discuss path results from colonoscopy.

## 2019-05-18 NOTE — Telephone Encounter (Signed)
The pt was read the letter sent regarding path results and all questions answered

## 2019-06-11 ENCOUNTER — Encounter: Payer: Self-pay | Admitting: Adult Health

## 2019-06-11 ENCOUNTER — Ambulatory Visit (INDEPENDENT_AMBULATORY_CARE_PROVIDER_SITE_OTHER): Payer: 59 | Admitting: Adult Health

## 2019-06-11 ENCOUNTER — Ambulatory Visit: Payer: 59

## 2019-06-11 ENCOUNTER — Other Ambulatory Visit: Payer: Self-pay

## 2019-06-11 VITALS — BP 146/73 | HR 71 | Temp 98.9°F | Ht 74.0 in | Wt 200.8 lb

## 2019-06-11 DIAGNOSIS — M25522 Pain in left elbow: Secondary | ICD-10-CM

## 2019-06-11 DIAGNOSIS — M25422 Effusion, left elbow: Secondary | ICD-10-CM

## 2019-06-11 DIAGNOSIS — Z0000 Encounter for general adult medical examination without abnormal findings: Secondary | ICD-10-CM | POA: Diagnosis not present

## 2019-06-11 HISTORY — DX: Pain in left elbow: M25.522

## 2019-06-11 MED ORDER — PREDNISONE 20 MG PO TABS: ORAL_TABLET | ORAL | 0 refills | Status: DC

## 2019-06-11 NOTE — Patient Instructions (Addendum)
Gout  Gout is painful swelling of your joints. Gout is a type of arthritis. It is caused by having too much uric acid in your body. Uric acid is a chemical that is made when your body breaks down substances called purines. If your body has too much uric acid, sharp crystals can form and build up in your joints. This causes pain and swelling. Gout attacks can happen quickly and be very painful (acute gout). Over time, the attacks can affect more joints and happen more often (chronic gout). What are the causes?  Too much uric acid in your blood. This can happen because: ? Your kidneys do not remove enough uric acid from your blood. ? Your body makes too much uric acid. ? You eat too many foods that are high in purines. These foods include organ meats, some seafood, and beer.  Trauma or stress. What increases the risk?  Having a family history of gout.  Being male and middle-aged.  Being male and having gone through menopause.  Being very overweight (obese).  Drinking alcohol, especially beer.  Not having enough water in the body (being dehydrated).  Losing weight too quickly.  Having an organ transplant.  Having lead poisoning.  Taking certain medicines.  Having kidney disease.  Having a skin condition called psoriasis. What are the signs or symptoms? An attack of acute gout usually happens in just one joint. The most common place is the big toe. Attacks often start at night. Other joints that may be affected include joints of the feet, ankle, knee, fingers, wrist, or elbow. Symptoms of an attack may include:  Very bad pain.  Warmth.  Swelling.  Stiffness.  Shiny, red, or purple skin.  Tenderness. The affected joint may be very painful to touch.  Chills and fever. Chronic gout may cause symptoms more often. More joints may be involved. You may also have white or yellow lumps (tophi) on your hands or feet or in other areas near your joints. How is this treated?   Treatment for this condition has two phases: treating an acute attack and preventing future attacks.  Acute gout treatment may include: ? NSAIDs. ? Steroids. These are taken by mouth or injected into a joint. ? Colchicine. This medicine relieves pain and swelling. It can be given by mouth or through an IV tube.  Preventive treatment may include: ? Taking small doses of NSAIDs or colchicine daily. ? Using a medicine that reduces uric acid levels in your blood. ? Making changes to your diet. You may need to see a food expert (dietitian) about what to eat and drink to prevent gout. Follow these instructions at home: During a gout attack   If told, put ice on the painful area: ? Put ice in a plastic bag. ? Place a towel between your skin and the bag. ? Leave the ice on for 20 minutes, 2-3 times a day.  Raise (elevate) the painful joint above the level of your heart as often as you can.  Rest the joint as much as possible. If the joint is in your leg, you may be given crutches.  Follow instructions from your doctor about what you cannot eat or drink. Avoiding future gout attacks  Eat a low-purine diet. Avoid foods and drinks such as: ? Liver. ? Kidney. ? Anchovies. ? Asparagus. ? Herring. ? Mushrooms. ? Mussels. ? Beer.  Stay at a healthy weight. If you want to lose weight, talk with your doctor. Do not lose weight  too fast.  Start or continue an exercise plan as told by your doctor. Eating and drinking  Drink enough fluids to keep your pee (urine) pale yellow.  If you drink alcohol: ? Limit how much you use to:  0-1 drink a day for women.  0-2 drinks a day for men. ? Be aware of how much alcohol is in your drink. In the [place] one drink equals one 12 oz bottle of beer (355 mL), one 5 oz glass of wine (148 mL), or one 1 oz glass of hard liquor (44 mL). General instructions  Take over-the-counter and prescription medicines only as told by your doctor.  Do not drive or  use heavy machinery while taking prescription pain medicine.  Return to your normal activities as told by your doctor. Ask your doctor what activities are safe for you.  Keep all follow-up visits as told by your doctor. This is important. Contact a doctor if:  You have another gout attack.  You still have symptoms of a gout attack after 10 days of treatment.  You have problems (side effects) because of your medicines.  You have chills or a fever.  You have burning pain when you pee (urinate).  You have pain in your lower back or belly. Get help right away if:  You have very bad pain.  Your pain cannot be controlled.  You cannot pee. Summary  Gout is painful swelling of the joints.  The most common site of pain is the big toe, but it can affect other joints.  Medicines and avoiding some foods can help to prevent and treat gout attacks. This information is not intended to replace advice given to you by your health care provider. Make sure you discuss any questions you have with your health care provider. Document Released: 08/24/2008 Document Revised: 06/07/2018 Document Reviewed: 06/07/2018 Elsevier Patient Education  [place].  We will call you when lab results are available. Please take Prednisone 20mg  twice daily for five days. Recommend OTC Acetaminophen 325 1-2 tabs every 6 hrs as needed for pain. Continue to social distance and wear a mask when in public. FEEL BETTER!

## 2019-06-11 NOTE — Progress Notes (Signed)
Subjective:    Patient ID: Steven Park, male    DOB: 13-Feb-1991, 28 y.o.   MRN: [id number]  HPI :  Mr. Prichett presents with L elbow pain, swelling, and warmth that developed yesterday morning. Pain is constant, described as "sharp", rated 8/10. He denies numbness/tingling in L hand. He is R hand dominant. 10 days ago he feel from his parked boat (in his driveway) to the ground, estimated to be a 4' drop. He reports striking the L side if his body on the ground- primarily L anterior chest and L FA. He did not seek medical care after fall. He reports consuming beer and red meat over the weekend, denies excessive cheese or seafood intake. He is an excellent water drinker >100 oz/day consistently. He denies personal hx of Gout, however his mother and maternal grandfather both have Gout He has used OTC Ice and "BioFreeze"- without any sx relief  Patient Care Team    Relationship Specialty Notifications Start End  Cova Knieriem, Berna Spare, NP PCP - General Family Medicine  09/16/17     Patient Active Problem List   Diagnosis Date Noted  . Pain and swelling of elbow, left 06/11/2019  . Dysuria 09/19/2018  . Internal hemorrhoid, bleeding; nonthrombosed 05/30/2018  . Rectal bleeding 05/30/2018  . Loose stools 05/30/2018  . Healthcare maintenance 10/17/2017  . Left ear pain 10/17/2017  . Adjustment disorder with mixed anxiety and depressed mood 07/22/2015  . Airway hyperreactivity 07/22/2015  . Migraine 07/22/2015     Past Medical History:  Diagnosis Date  . Allergy   . Arthritis    "lower back down  . Asthma   . GERD (gastroesophageal reflux disease)      Past Surgical History:  Procedure Laterality Date  . COLONOSCOPY    . NO PAST SURGERIES       Family History  Problem Relation Age of Onset  . Arthritis Mother   . Diabetes Mother   . Other Father        pt doesn't really talk with him  . Hypertension Maternal Grandfather      Social History   Substance and  Sexual Activity  Drug Use No     Social History   Substance and Sexual Activity  Alcohol Use Yes  . Alcohol/week: 6.0 standard drinks  . Types: 6 Cans of beer per week     Social History   Tobacco Use  Smoking Status Never Smoker  Smokeless Tobacco Former Systems developer  . Types: Chew     Outpatient Encounter Medications as of 06/11/2019  Medication Sig  . albuterol (VENTOLIN HFA) 108 (90 Base) MCG/ACT inhaler Inhale 2 puffs into the lungs. Every 4-6 hours as needed  . cetirizine (ZYRTEC) 10 MG tablet Take 10 mg by mouth daily as needed.   . predniSONE (DELTASONE) 20 MG tablet 1 tablet twice daily for 5 days   No facility-administered encounter medications on file as of 06/11/2019.     Allergies: Aspirin and Citalopram  Body mass index is 25.78 kg/m.  Blood pressure (!) 146/73, pulse 71, temperature 98.9 F (37.2 C), temperature source Oral, height 6\' 2"  (1.88 m), weight 200 lb 12.8 oz (91.1 kg), SpO2 97 %.    Review of Systems  Constitutional: Positive for fatigue. Negative for activity change, appetite change, chills, diaphoresis, fever and unexpected weight change.  Eyes: Negative for visual disturbance.  Respiratory: Negative for cough, chest tightness, shortness of breath, wheezing and stridor.   Cardiovascular: Negative for chest pain,  palpitations and leg swelling.  Gastrointestinal: Negative for abdominal distention, abdominal pain, blood in stool, constipation, diarrhea, nausea and vomiting.  Endocrine: Negative for cold intolerance, heat intolerance, polydipsia, polyphagia and polyuria.  Musculoskeletal: Positive for arthralgias, joint swelling and myalgias. Negative for back pain, gait problem, neck pain and neck stiffness.  Hematological: Negative for adenopathy. Does not bruise/bleed easily.       Objective:   Physical Exam Vitals signs and nursing note reviewed.  Constitutional:      General: He is not in acute distress.    Appearance: Normal appearance.  He is not ill-appearing, toxic-appearing or diaphoretic.  Cardiovascular:     Rate and Rhythm: Normal rate.     Pulses: Normal pulses.     Heart sounds: Normal heart sounds. No murmur. No friction rub. No gallop.   Pulmonary:     Effort: Pulmonary effort is normal. No respiratory distress.     Breath sounds: No stridor. No wheezing, rhonchi or rales.  Chest:     Chest wall: No tenderness.  Musculoskeletal:        General: Swelling and tenderness present. No deformity or signs of injury.     Left shoulder: Normal.     Left elbow: He exhibits swelling. He exhibits normal range of motion. Tenderness found. Lateral epicondyle tenderness noted.     Left wrist: Normal.     Comments: L elbow-Excessive warmth noted  Skin:    General: Skin is warm and dry.     Capillary Refill: Capillary refill takes less than 2 seconds.  Neurological:     Mental Status: He is alert and oriented to person, place, and time.     Coordination: Coordination normal.  Psychiatric:        Mood and Affect: Mood normal.        Behavior: Behavior normal.        Thought Content: Thought content normal.        Judgment: Judgment normal.        Assessment & Plan:   1. Pain and swelling of elbow, left   2. Healthcare maintenance     Pain and swelling of elbow, left Xray- Uric Acid/CBC Drawn Please take Prednisone 20mg  twice daily for five days. We will call you when lab results are available. Please take Prednisone 20mg  twice daily for five days. Recommend OTC Acetaminophen 325 1-2 tabs every 6 hrs as needed for pain.    Healthcare maintenance Continue to social distance and wear a mask when in public.    FOLLOW-UP:  Return if symptoms worsen or fail to improve.

## 2019-06-11 NOTE — Assessment & Plan Note (Signed)
Continue to social distance and wear a mask when in public 

## 2019-06-11 NOTE — Assessment & Plan Note (Addendum)
Xray- IMPRESSION: Mild posterior soft tissue swelling which may represent fluid in the olecranon bursa/bursitis. No acute bony abnormality or joint effusion.  Uric Acid/CBC Drawn If negative for Gout, will refer to Orthopedic Specialist  Please take Prednisone 20mg  twice daily for five days. We will call you when lab results are available. Please take Prednisone 20mg  twice daily for five days. Recommend OTC Acetaminophen 325 1-2 tabs every 6 hrs as needed for pain.

## 2019-06-12 ENCOUNTER — Other Ambulatory Visit: Payer: Self-pay | Admitting: Adult Health

## 2019-06-12 LAB — CBC WITH DIFFERENTIAL/PLATELET
Basophils Absolute: 0.1 10*3/uL (ref 0.0–0.2)
Basos: 1 %
EOS (ABSOLUTE): 0.4 10*3/uL (ref 0.0–0.4)
Eos: 4 %
Hematocrit: 43.8 % (ref 37.5–51.0)
Hemoglobin: 15.7 g/dL (ref 13.0–17.7)
Immature Grans (Abs): 0.1 10*3/uL (ref 0.0–0.1)
Immature Granulocytes: 1 %
Lymphocytes Absolute: 2.1 10*3/uL (ref 0.7–3.1)
Lymphs: 21 %
MCH: 30.7 pg (ref 26.6–33.0)
MCHC: 35.8 g/dL — ABNORMAL HIGH (ref 31.5–35.7)
MCV: 86 fL (ref 79–97)
Monocytes Absolute: 1 10*3/uL — ABNORMAL HIGH (ref 0.1–0.9)
Monocytes: 10 %
Neutrophils Absolute: 6.3 10*3/uL (ref 1.4–7.0)
Neutrophils: 63 %
Platelets: 259 10*3/uL (ref 150–450)
RBC: 5.12 x10E6/uL (ref 4.14–5.80)
RDW: 12.3 % (ref 11.6–15.4)
WBC: 10 10*3/uL (ref 3.4–10.8)

## 2019-06-12 LAB — URIC ACID: Uric Acid: 6.1 mg/dL (ref 3.7–8.6)

## 2019-06-12 MED ORDER — COLCHICINE 0.6 MG PO TABS: 0.6000 mg | ORAL_TABLET | Freq: Two times a day (BID) | ORAL | 0 refills | Status: DC

## 2019-10-15 ENCOUNTER — Ambulatory Visit: Payer: 59 | Admitting: Adult Health

## 2019-10-20 NOTE — Progress Notes (Deleted)
   Subjective:    Patient ID: Steven Park, male    DOB: 1991-03-05, 28 y.o.   MRN: [id number]  HPI:  Steven Park is here for CPE  Needs Fasting Labs  Healthcare Maintenance: Immunizations-  Patient Care Team    Relationship Specialty Notifications Start End  Steven Grandchild, NP PCP - General Family Medicine  09/16/17     Patient Active Problem List   Diagnosis Date Noted  . Pain and swelling of elbow, left 06/11/2019  . Dysuria 09/19/2018  . Internal hemorrhoid, bleeding; nonthrombosed 05/30/2018  . Rectal bleeding 05/30/2018  . Loose stools 05/30/2018  . Healthcare maintenance 10/17/2017  . Left ear pain 10/17/2017  . Adjustment disorder with mixed anxiety and depressed mood 07/22/2015  . Airway hyperreactivity 07/22/2015  . Migraine 07/22/2015     Past Medical History:  Diagnosis Date  . Allergy   . Arthritis    "lower back down  . Asthma   . GERD (gastroesophageal reflux disease)      Past Surgical History:  Procedure Laterality Date  . COLONOSCOPY    . NO PAST SURGERIES       Family History  Problem Relation Age of Onset  . Arthritis Mother   . Diabetes Mother   . Other Father        pt doesn't really talk with him  . Hypertension Maternal Grandfather      Social History   Substance and Sexual Activity  Drug Use No     Social History   Substance and Sexual Activity  Alcohol Use Yes  . Alcohol/week: 6.0 standard drinks  . Types: 6 Cans of beer per week     Social History   Tobacco Use  Smoking Status Never Smoker  Smokeless Tobacco Former Systems developer  . Types: Chew     Outpatient Encounter Medications as of 10/22/2019  Medication Sig  . albuterol (VENTOLIN HFA) 108 (90 Base) MCG/ACT inhaler Inhale 2 puffs into the lungs. Every 4-6 hours as needed  . cetirizine (ZYRTEC) 10 MG tablet Take 10 mg by mouth daily as needed.   . colchicine 0.6 MG tablet Take 1 tablet (0.6 mg total) by mouth 2 (two) times daily.  . predniSONE  (DELTASONE) 20 MG tablet 1 tablet twice daily for 5 days   No facility-administered encounter medications on file as of 10/22/2019.     Allergies: Aspirin and Citalopram  There is no height or weight on file to calculate BMI.  There were no vitals taken for this visit.     Review of Systems     Objective:   Physical Exam        Assessment & Plan:  No diagnosis found.  No problem-specific Assessment & Plan notes found for this encounter.    FOLLOW-UP:  No follow-ups on file.

## 2019-10-22 ENCOUNTER — Encounter: Payer: 59 | Admitting: Adult Health

## 2020-01-30 ENCOUNTER — Other Ambulatory Visit: Payer: Self-pay

## 2020-01-30 ENCOUNTER — Encounter: Payer: Self-pay | Admitting: Family Medicine

## 2020-01-30 ENCOUNTER — Ambulatory Visit (INDEPENDENT_AMBULATORY_CARE_PROVIDER_SITE_OTHER): Payer: 59 | Admitting: Family Medicine

## 2020-01-30 VITALS — BP 118/78 | HR 78 | Temp 97.9°F | Resp 12 | Ht 75.0 in | Wt 218.4 lb

## 2020-01-30 DIAGNOSIS — Z8349 Family history of other endocrine, nutritional and metabolic diseases: Secondary | ICD-10-CM

## 2020-01-30 DIAGNOSIS — R000 Tachycardia, unspecified: Secondary | ICD-10-CM

## 2020-01-30 DIAGNOSIS — R531 Weakness: Secondary | ICD-10-CM | POA: Diagnosis not present

## 2020-01-30 DIAGNOSIS — R42 Dizziness and giddiness: Secondary | ICD-10-CM

## 2020-01-30 DIAGNOSIS — Z0000 Encounter for general adult medical examination without abnormal findings: Secondary | ICD-10-CM | POA: Diagnosis not present

## 2020-01-30 NOTE — Progress Notes (Signed)
Impression and Recommendations:    1. Dizziness   2. Weakness   3. Tachycardia   4. Healthcare maintenance   5. Family history of thyroid disease   6. Episodic lightheadedness     - Last OV 06/11/2019.   Tachycardia, Dizziness, Weakness - Family Hx Thyroid Disease - Reviewed patient's symptoms extensively during appointment today. - Discussed also possible psychophysiological symptoms of anxiety. - Education and counseling provided.  - Encouraged patient to begin meditation for sleep and anxiety utilizing apps such as Calm, Headspace, Ten Percent, or Breathe.  Told patient to meditate 2-4 times per day.  - Patient declines medication or any other intervention at this time. - Patient declines counseling/therapy at this time.  - In addition to the option of therapy/counseling or prescription intervention, reviewed the "spokes of the wheel" of mood and health management.  Stressed the importance of ongoing prudent habits, including regular exercise, appropriate sleep hygiene, healthful dietary habits, and prayer/meditation to relax.  - Will continue to monitor and provide further options at patient's request.  Health Counseling & Preventative Maintenance - Advised patient to continue working toward exercising to improve overall mental, physical, and emotional health.    - Encouraged patient to engage in daily physical activity as tolerated, especially a formal exercise routine.  Recommended that the patient eventually strive for at least 150 minutes of moderate cardiovascular activity per week according to guidelines established by the [facility].   - Healthy dietary habits encouraged, including low-carb, and high amounts of lean protein in diet.   - Patient should also consume adequate amounts of water.  - Health counseling performed.  All questions answered.  Recommendations - Need for repeat lab work.   See orders. - Return for f/up in 2 months.   Orders Placed This  Encounter  Procedures  . Direct LDL  . Comprehensive metabolic panel  . CBC  . Hemoglobin A1c  . VITAMIN D 25 Hydroxy (Vit-D Deficiency, Fractures)  . TSH  . T4, free    Gross side effects, risk and benefits, and alternatives of medications and treatment plan in general discussed with patient.  Patient is aware that all medications have potential side effects and we are unable to predict every side effect or drug-drug interaction that may occur.   Patient will call with any questions prior to using medication if they have concerns.    Expresses verbal understanding and consents to current therapy and treatment regimen.  No barriers to understanding were identified.  Red flag symptoms and signs discussed in detail.  Patient expressed understanding regarding what to do in case of emergency\urgent symptoms  Please see AVS handed out to patient at the end of our visit for further patient instructions/ counseling done pertaining to today's office visit.   Return for f/up 2 months for stress type sx vs thryoid, sooner if issues.     Note:  This note was prepared with assistance of Dragon voice recognition software. Occasional wrong-word or sound-a-like substitutions may have occurred due to the inherent limitations of voice recognition software.   The 21st Century Cures Act was signed into law in 2016 which includes the topic of electronic health records.  This provides immediate access to information in MyChart.  This includes consultation notes, operative notes, office notes, lab results and pathology reports.  If you have any questions about what you read please let us know at your next visit or call us at the office.  We are right  here with you.   This case required medical decision making of at least moderate complexity.  This document serves as a record of services personally performed by Thomasene Lot, DO. It was created on her behalf by Peggye Fothergill, a trained medical scribe.  The creation of this record is based on the scribe's personal observations and the provider's statements to them.   This case required medical decision making of at least moderate complexity. The above documentation from Peggye Fothergill, medical scribe, has been reviewed by Carlye Grippe, D.O.      --------------------------------------------------------------------------------------------------------------------------------------------------------------------------------------------------------------------------------------------    Subjective:     Zola Button, am serving as scribe for Dr.Nakiea Metzner.   HPI: Steven Park is a 29 y.o. male who presents to [facility] today for issues as discussed below.   - Episodes of Dizziness, Lightheadedness Says he's here to see if he has thyroid issues.    Notes he has trouble sleeping; "I may be asleep for an hour, and then I'm wide awake, and may crash for another two hours, and then wide awake again."  Says that this has been going on for years.  "It seems like it's getting worse; I don't know, maybe I'm getting a little bit older."  Says he keeps having dizzy spells; "more of like a lightheaded, kind of high feeling."  Notes one night it was so bad, he thought he was going to pass out.  Says "I really felt like I was going to go."  He was laying in bed and notes his body felt heavy.  He called his wife to assist him at this time.  Says "laying in bed, about to go to sleep, just kind of chilling out, and next thing I knew it felt like the room was spinning."  Says this episode was "really bad" and lasted several minutes.  Felt like it was "getting worse and worse," and that's when he called his wife in.  When he sat up, he started feeling better.  This happened twice, and the second episode was worse.  The episodes were maybe two weeks apart.  The last episode was about four weeks ago.  He did  not pass out, did not vomit.  Denies nausea.  Did not lose his vision.  States "I couldn't concentrate on anything."  Regarding congestion in his ears, pain in his ears, or congestion in his sinuses, says "I've always had sinus issues, but nothing's been out of the norm lately."  He began to suspect thyroid due to pulling a couple of late nights at work, reviewing his symptoms with a co-worker.  Co-worker said that his dad went through thyroid issues, and asked the patient to consider it.  - Heart Palpitations he has had heart palpitations when paying attention to it.  Does no have heart palpitations while busy at work or busy doing things  - Stress Notes "I've got a lot going on."  They are buying some property, building a house.  Says he's "just thinking about stuff like that, getting the property cleared."  [place] and he[place] the house there.  Notes he goes up there on the weekends.  Then has to clear the new property to build, and remodel the house that they're currently living in.  Says "with the two kids, one thousand square feet is closing in fast."  His wife is helping him, but "that's  it."  They're trying to do all of the work themselves.  - Anxiety Confirms sometimes experiencing a sensation of heaviness in his chest, tightness, heart racing, difficulty breathing.  Notes when he experiences these feelings, he just waits until these feelings pass.  "It just depends on what I'm thinking about in that moment."  Says "for as long as I can remember, I've had issues sleeping at night, but it's now because I can't shut down."  Says "I think about things constantly."  With the stuff going on, notes "even if I don't realize it, my mind's running 1000 miles per hour."  He is not currently doing anything to cope with his stress.  He used to go to the gym quite a bit, but notes "having kids slowed that down."  His kids are age 77  and 2.  Says recently, listening to the tick-tock of a clock has helped to calm him down.  They have a sound machine in their room, but notes "sometimes I'll pick up a pattern with it and I can't stand it."  - Eating Habits He eats pretty regularly; "at work, there's times I don't get lunch and stuff, but I'd say [I eat] pretty regular."  Thinks his appetite has come and gone over the past couple of weeks.  Says sometimes he sits and has a couple of bites and feels full, whereas normally in the past he can "chow down."  - History of Colonoscopy He had a colonoscopy last year due to bleeding.  He isn't sure if this bleeding was due to stress.  - Alcohol Use Notes his drinking has slowed down a bit.  Says "I'm lucky if I drink six beers a month now.  In the past, he used to sometimes drink a case on a weekend.    Depression screen [facility] 2/9 01/30/2020 06/11/2019 10/19/2018  Decreased Interest 1 0 1  Down, Depressed, Hopeless 1 0 1  PHQ - 2 Score 2 0 2  Altered sleeping 2 0 0  Tired, decreased energy 3 0 1  Change in appetite 2 0 0  Feeling bad or failure about yourself  2 0 0  Trouble concentrating 1 0 0  Moving slowly or fidgety/restless 2 0 0  Suicidal thoughts 0 0 0  PHQ-9 Score 14 0 3  Difficult doing work/chores Somewhat difficult - Not difficult at all   GAD 7 : Generalized Anxiety Score 01/30/2020  Nervous, Anxious, on Edge 2  Control/stop worrying 1  Worry too much - different things 1  Trouble relaxing 1  Restless 1  Easily annoyed or irritable 1  Afraid - awful might happen 1  Total GAD 7 Score 8  Anxiety Difficulty Somewhat difficult      Wt Readings from Last 3 Encounters:  01/30/20 218 lb 6.4 oz (99.1 kg)  06/11/19 200 lb 12.8 oz (91.1 kg)  01/30/19 207 lb (93.9 kg)   BP Readings from Last 3 Encounters:  01/30/20 118/78  06/11/19 (!) 146/73  05/08/19 97/60   Pulse Readings from Last 3 Encounters:  01/30/20 78  06/11/19 71  05/08/19 68   BMI Readings  from Last 3 Encounters:  01/30/20 27.30 kg/m  06/11/19 25.78 kg/m  01/30/19 26.58 kg/m     Patient Care Team    Relationship Specialty Notifications Start End  Esaw Grandchild, NP PCP - General Family Medicine  09/16/17      Patient Active Problem List   Diagnosis Date Noted  . Pain and  swelling of elbow, left 06/11/2019  . Dysuria 09/19/2018  . Internal hemorrhoid, bleeding; nonthrombosed 05/30/2018  . Rectal bleeding 05/30/2018  . Loose stools 05/30/2018  . Healthcare maintenance 10/17/2017  . Left ear pain 10/17/2017  . Adjustment disorder with mixed anxiety and depressed mood 07/22/2015  . Airway hyperreactivity 07/22/2015  . Migraine 07/22/2015    Past Medical history, Surgical history, Family history, Social history, Allergies and Medications have been entered into the medical record, reviewed and changed as needed.    Current Meds  Medication Sig  . albuterol (VENTOLIN HFA) 108 (90 Base) MCG/ACT inhaler Inhale 2 puffs into the lungs. Every 4-6 hours as needed    Allergies:  Allergies  Allergen Reactions  . Citalopram Other (See Comments)    Difficulty concentrating Difficulty concentrating  . Aspirin Swelling     Review of Systems:  A fourteen system review of systems was performed and found to be positive as per HPI.   Objective:   Blood pressure 118/78, pulse 78, temperature 97.9 F (36.6 C), temperature source Oral, resp. rate 12, height 6\' 3"  (1.905 m), weight 218 lb 6.4 oz (99.1 kg), SpO2 98 %. Body mass index is 27.3 kg/m. General:  Well Developed, well nourished, appropriate for stated age.  Neuro:  Alert and oriented,  extra-ocular muscles intact  HEENT:  Normocephalic, atraumatic, neck supple, no carotid bruits appreciated  Skin:  no gross rash, warm, pink. Cardiac:  RRR, S1 S2 Respiratory:  ECTA B/L and A/P, Not using accessory muscles, speaking in full sentences- unlabored. Vascular:  Ext warm, no cyanosis apprec.; cap RF less 2  sec. Psych:  No HI/SI, judgement and insight good, Euthymic mood. Full Affect.

## 2020-01-30 NOTE — Patient Instructions (Addendum)
Look into meditation apps such as Calm, Headspace, Ten Percent, Breathe.  These are all apps that can aid with stress relief and sleep.  If you have insomnia or difficulty sleeping, this information is for you:  - Avoid caffeinated beverages after lunch,  no alcoholic beverages,  no eating within 2-3 hours of lying down,  avoid exposure to blue light before bed,  avoid daytime naps, and  needs to maintain a regular sleep schedule- go to sleep and wake up around the same time every night.   - Resolve concerns or worries before entering bedroom:  Discussed relaxation techniques with patient and to keep a journal to write down fears\ worries.  I suggested seeing a counselor for CBT.   - Recommend patient meditate or do deep breathing exercises to help relax.   Incorporate the use of white noise machines or listen to "sleep meditation music", or recordings of guided meditations for sleep from YouTube which are free, such as  "guided meditation for detachment from over thinking"  by Ina Kick.        What is Chronic Stress Syndrome, Symptoms & Ways to Deal With it   What is Chronic Stress Syndrome?  Chronic Stress Syndrome is something which can now be called as a medical condition due to the amount of stress an individual is going through these days. Chronic Stress Syndrome causes the body and mind to shutdown and the person has no control over himself or herself. Due to the demands of modern day life and the hardship throughout day and night takes its toll over a period of time and the body and brain starts demanding rest and a break. This leads to certain symptoms where your performance level starts to dip at work, you become irritable both at work and at home, you may stop enjoying activities you previously liked, you may become depressed, you may get angry for even small things. Chronic Stress Syndrome can significantly impact your quality life. Thus it is important understand the symptoms of  Chronic Stress Syndrome and react accordingly in order to cope up with it.  It is important to note here that a balanced work-home equation should be drawn to cut down symptoms of Chronic Stress Syndrome. Minor stressors can be overcome by the body's inbuilt stress response but when there is unending stress for a long period of time then an external help is required to ease the stress.  Chronic Stress Syndrome can physically and psychologically drain you over a period of time. For such cases stress management is the best way to cope up with Chronic Stress Syndrome. If Chronic Stress Syndrome is not treated then it may result in many health hazards like anxiety, muscle pain, insomnia, and high blood pressure along with a compromised immune system leading to frequent infections and missed days from work.    What are the Symptoms of Chronic Stress Syndrome?   The symptoms of Chronic Stress Syndrome are variable and range from generalized symptoms to emotional symptoms along with behavioral and cognitive symptoms. Some of these symptoms have been delineated below:  Generalized Symptoms of Chronic Stress Syndrome are: Anxiety Depression Social isolation Headache Abdominal pain Lack of sleep Back pain Difficulty in concentrating Hypertension Hemorrhoids Varicose veins Panic attacks/ Panic disorder Cardiovascular diseases.   Some of the Emotional Symptoms of Chronic Stress Syndrome are: To become easily agitated, moody and frustrated Feeling overwhelmed which makes you feel like you are losing control. Having difficulty relaxing and have a peaceful  mind Having low self esteem Feeling lonely Feeling worthless Feeling depressed Avoiding social environment.   Some of the Physical Symptoms of Chronic Stress Syndrome are: Headaches Lethargy Alternating diarrhea and constipation Nausea Muscles aches and pains Insomnia Rapid heartbeat and chest pain Infections and frequent  colds Decreased libido Nervousness and shaking Tinnitus Sweaty palms Dry mouth Clenched jaw.  Some of the Cognitive Symptoms of Chronic Stress Syndrome are: Constant worrying Racing thoughts Disorganization and forgetfulness Inability to focus Poor judgment Abundance of negativity.  Some of the Behavioral Symptoms of Chronic Stress Syndrome are: Changes in appetite with less desire to eat Avoiding responsibilities Indulgence in alcohol or recreational drug use Increased nail biting and being fidgety Ways to Deal With Chronic Stress Syndrome    Chronic Stress Syndrome is not something which cannot be addressed. A bit of effort from your side in the form of lifestyle modifications, a little bit of exercise, a balanced work life equation can do wonders and help you get rid of Chronic Stress Syndrome.  Get Proper Sleep: It has been proved that Chronic Stress Syndrome causes loss of sleep where an individual may not even be able to sleep for days unending. This may result in the individual feeling lethargic and unable to focus at work the following morning. This may lead to decreased performance at work. Thus, it is important to have a good sleep-wake cycle. For this, try and not drink any caffeinated beverage about four hours prior to going to sleep, as caffeine pumps up the adrenaline and causes you to stay awake resulting ultimately in Chronic Stress Syndrome.  Avoid Alcohol and Drugs: Another way to get rid of Chronic Stress Syndrome is lifestyle modifications. Stay away from alcohol and other recreational drugs. Take Short Frequent Breaks at Work: Try to take frequent breaks from work and do not work continuously. Try and manage your work in such a way that you even meet your deadline and come home on time for a happy dinner with family. A good time spent with family and kids does wonders in not only dealing with Chronic Stress Syndrome but also preventing it.  Become Physically  Active: Another step towards getting rid of Chronic Stress Syndrome is physical activity. If you do not have time to spend at the gym then at least try and go for daily walks for about half an hour a day which not only keeps the stress away but also is good for your overall health. Physical activity leads to production of endorphins which will make you feel relaxed and feel good.  Healthy Diet Can Help You Deal With Chronic Stress Syndrome: Have a balanced and healthy diet is another step towards a stress free life and keeping Chronic Stress Syndrome at Chance. If time is a constraint then you can try eating three small meals a day. Try and avoid fast foods and take foods which are healthy and rich in proteins, fiber, and carbohydrates to boost your energy system.  Music Can Soothe Your Mind: Light music is one of the best and most effective relaxation techniques that one can try to overcome stress. It has shown to calm down the mind and take you away from all the stressors that you may be having. These days it is also being used as a therapy in some institutes for overcoming stress. It is important here to discuss the importance of a good social support system for patients with Chronic Stress Syndrome, as a good social support framework can do  wonders in taking the stress away from the patient and overcoming Chronic Stress Syndrome.  Meditation Can Help You Deal With Chronic Stress Syndrome Effectively: Meditation and yoga has also shown to be quite effective in relaxing the mind and coping up with Chronic Stress Syndrome   In cases where these measures are not helpful, then it is time for you to consult with a skilled psychologist or a psychiatrist for potential therapies or medications to control the stress response.   The psychologist can help you with a variety of steps for coping up with Chronic Stress Syndrome. Relaxation techniques and behavioral therapy are some of the methods employed by  psychologists. In some cases, medications can also be given to help relax the patient.  Since Chronic Stress Syndrome is both emotionally and physically draining for the patient and it also adversely affects the family life of the patient hence it is important for the patient to recognize the condition and taking steps to cope up with it. Escaping measures like alcohol and drug use are of no help as they only aggravate the condition apart from their other health hazards. If this condition is ignored or left untreated it can lead to various medical conditions like anxiety and depression and various other medical conditions.  Last but not least, smile as often as you can as it is the best gift that you can give to someone. The best way to stay relaxed is to have a good smile, exercise daily, spend time with your family, meditation and if required consultation with a good psychologist so that you can live a stress free life and overcome the symptoms of Chronic Stress Syndrome.

## 2020-01-31 LAB — CBC
Hematocrit: 42.1 % (ref 37.5–51.0)
Hemoglobin: 15.2 g/dL (ref 13.0–17.7)
MCH: 31.3 pg (ref 26.6–33.0)
MCHC: 36.1 g/dL — ABNORMAL HIGH (ref 31.5–35.7)
MCV: 87 fL (ref 79–97)
Platelets: 295 10*3/uL (ref 150–450)
RBC: 4.85 x10E6/uL (ref 4.14–5.80)
RDW: 12.2 % (ref 11.6–15.4)
WBC: 7.6 10*3/uL (ref 3.4–10.8)

## 2020-01-31 LAB — COMPREHENSIVE METABOLIC PANEL
ALT: 13 IU/L (ref 0–44)
AST: 19 IU/L (ref 0–40)
Albumin/Globulin Ratio: 1.8 (ref 1.2–2.2)
Albumin: 4.8 g/dL (ref 4.1–5.2)
Alkaline Phosphatase: 78 IU/L (ref 39–117)
BUN/Creatinine Ratio: 13 (ref 9–20)
BUN: 13 mg/dL (ref 6–20)
Bilirubin Total: 0.3 mg/dL (ref 0.0–1.2)
CO2: 25 mmol/L (ref 20–29)
Calcium: 9.6 mg/dL (ref 8.7–10.2)
Chloride: 102 mmol/L (ref 96–106)
Creatinine, Ser: 0.98 mg/dL (ref 0.76–1.27)
GFR calc Af Amer: 119 mL/min/{1.73_m2} (ref >59)
GFR calc non Af Amer: 103 mL/min/{1.73_m2} (ref >59)
Globulin, Total: 2.6 g/dL (ref 1.5–4.5)
Glucose: 65 mg/dL (ref 65–99)
Potassium: 4.4 mmol/L (ref 3.5–5.2)
Sodium: 142 mmol/L (ref 134–144)
Total Protein: 7.4 g/dL (ref 6.0–8.5)

## 2020-01-31 LAB — TSH: TSH: 0.484 u[IU]/mL (ref 0.450–4.500)

## 2020-01-31 LAB — LDL CHOLESTEROL, DIRECT: LDL Direct: 86 mg/dL (ref 0–99)

## 2020-01-31 LAB — HEMOGLOBIN A1C
Est. average glucose Bld gHb Est-mCnc: 97 mg/dL
Hgb A1c MFr Bld: 5 % (ref 4.8–5.6)

## 2020-01-31 LAB — VITAMIN D 25 HYDROXY (VIT D DEFICIENCY, FRACTURES): Vit D, 25-Hydroxy: 27.6 ng/mL — ABNORMAL LOW (ref 30.0–100.0)

## 2020-01-31 LAB — T4, FREE: Free T4: 1.26 ng/dL (ref 0.82–1.77)

## 2020-02-13 ENCOUNTER — Telehealth: Payer: Self-pay | Admitting: Family Medicine

## 2020-02-13 DIAGNOSIS — K625 Hemorrhage of anus and rectum: Secondary | ICD-10-CM

## 2020-02-13 NOTE — Telephone Encounter (Signed)
Pt with known h/o chronic rectal bleeding in stools many yrs with extensive w/up and no known cause. No anemia ever seen etc.      Eval by Dr Christella Hartigan of GI in office in 01/2019 with several subsequent labs and stool cx obtained which were all negative and Pt even had subsequent colonscopy done 04/2019- which multiple bx taken and all WNL's.    - Have pt f/up with GI regarding his ongoing sx of blood in stool in he is concerned, as there is nothing additional we as PCPs would do at this point.

## 2020-02-13 NOTE — Telephone Encounter (Signed)
Patient is aware of the below. Referral placed for Gastro. AS, CMA

## 2020-02-13 NOTE — Telephone Encounter (Signed)
Patient was seen 01/30/20 for dizziness and weakness. Patient states that he is having blood in stools. Patient states this has happened 2 times since last seen and is concerned of a stomach ulcer. Patient states the had colonoscopy 04/2019 due to bloody stools.   Findings:  - The terminal ileum appeared normal. - The right colon mucosa was normal, biopsies taken (jar 1). The left colon mucosa was very slightly erythematous without a distinct transition to normal mucosa, biopsies taken (jar 2). The rectum was moderately erthymatous and friable without a distinct transition to the left colon findings, biopsies taken (jar 3). - Biopsies for histology were taken with a cold forceps from the cecum for evaluation of microscopic colitis. - The exam was otherwise without abnormality on direct and retroflexion views.   Patient requesting advise.   Refer to GI? Please advise. AS, CMA

## 2020-02-17 IMAGING — DX LEFT ELBOW - COMPLETE 3+ VIEW
4 series · 4 of 4 positions shown · non-contrast
Comparison: None.

CLINICAL DATA: LEFT elbow pain and swelling.

EXAM:
LEFT ELBOW - COMPLETE 3+ VIEW

[elbow ap]
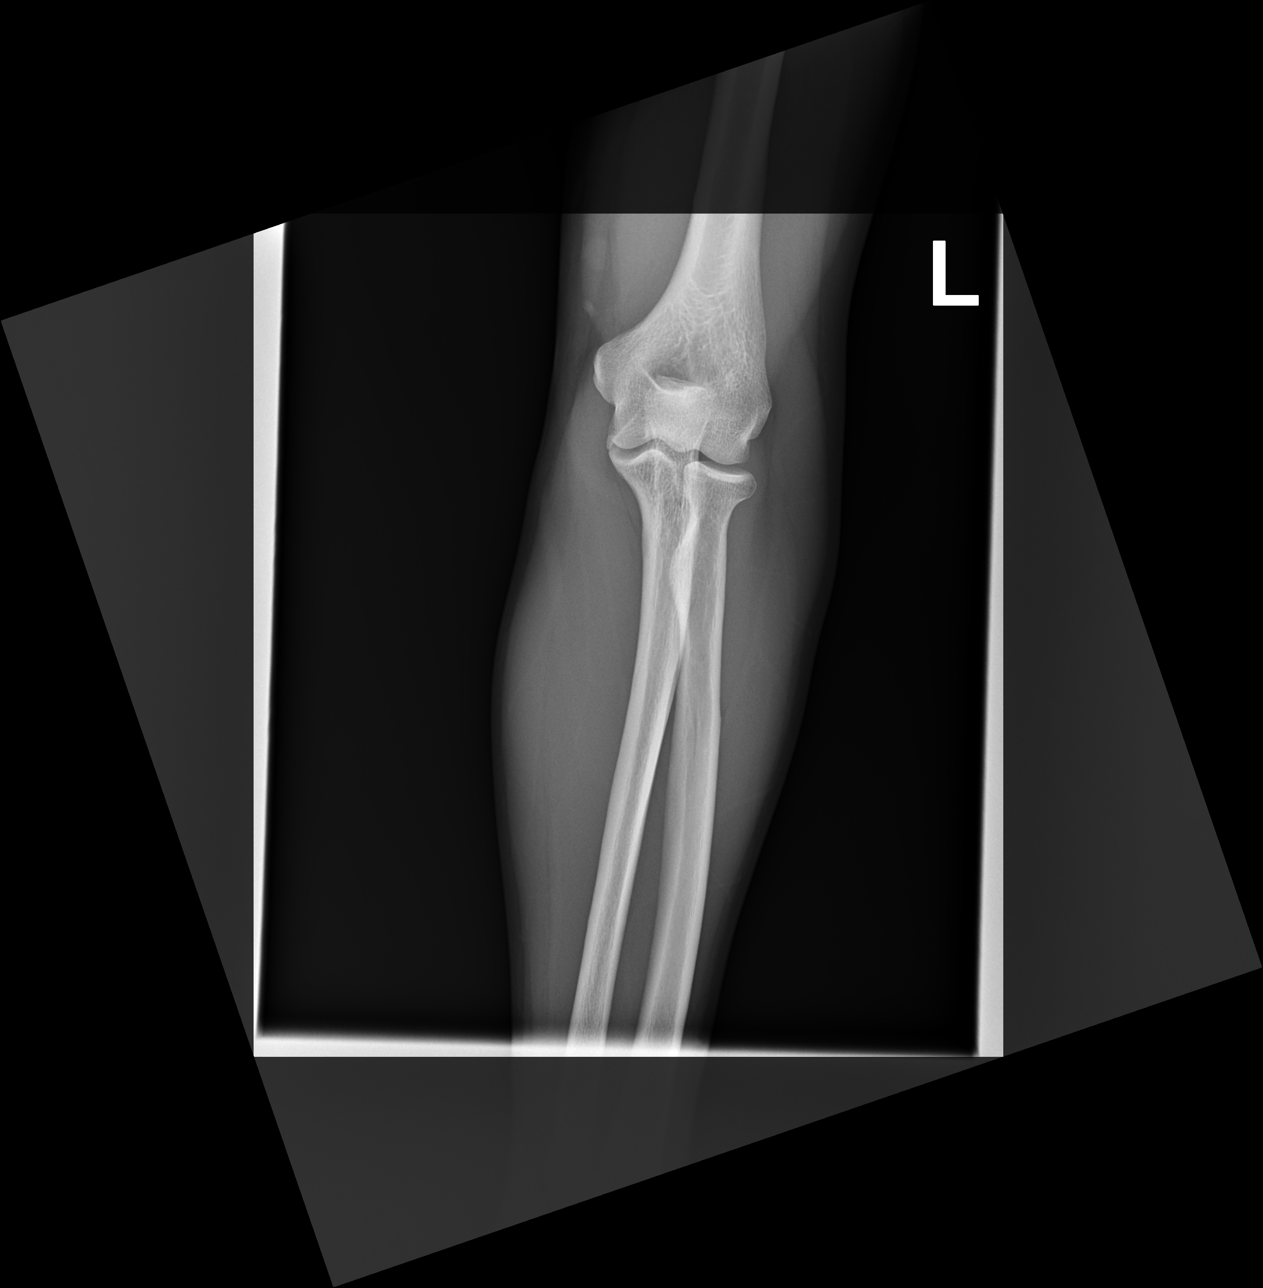

[elbow oblique (1 of 2)]
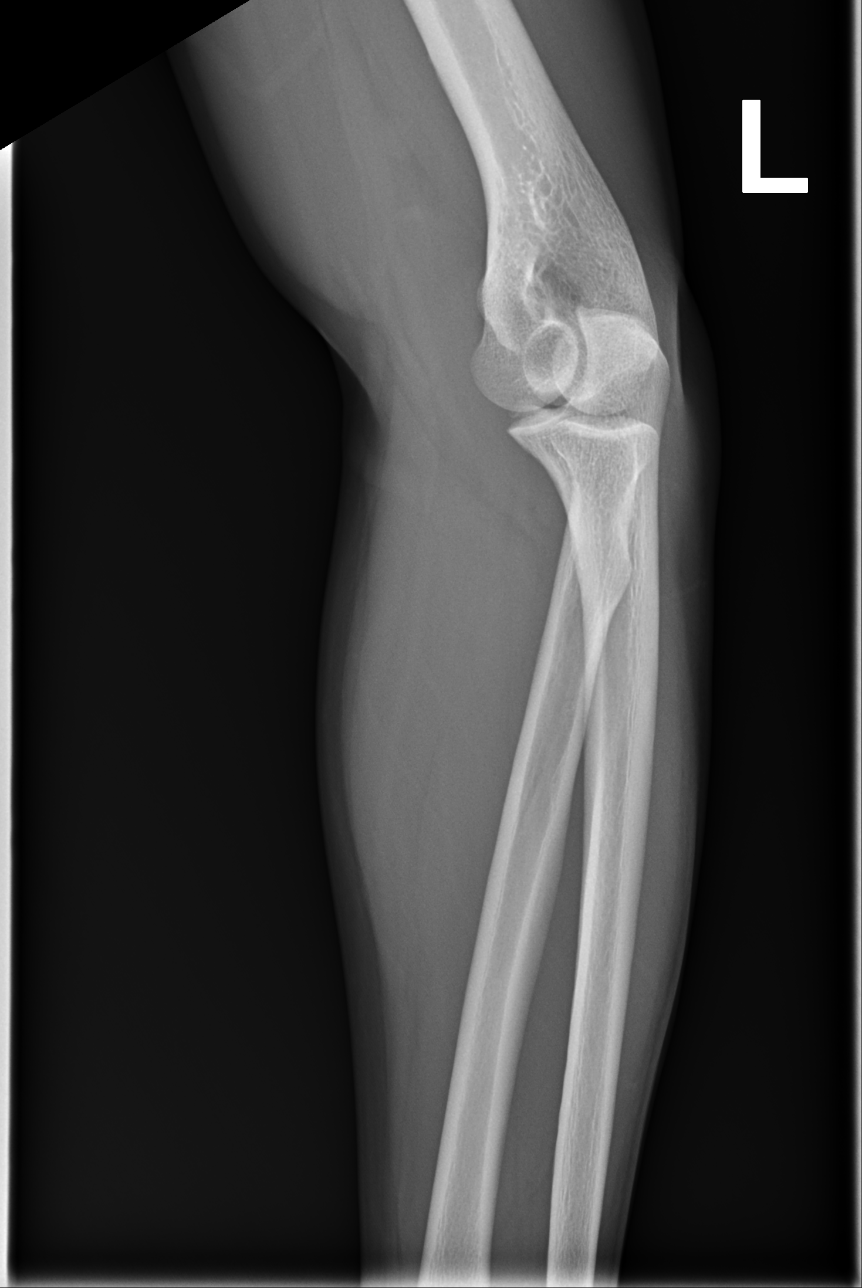

[elbow oblique (2 of 2)]
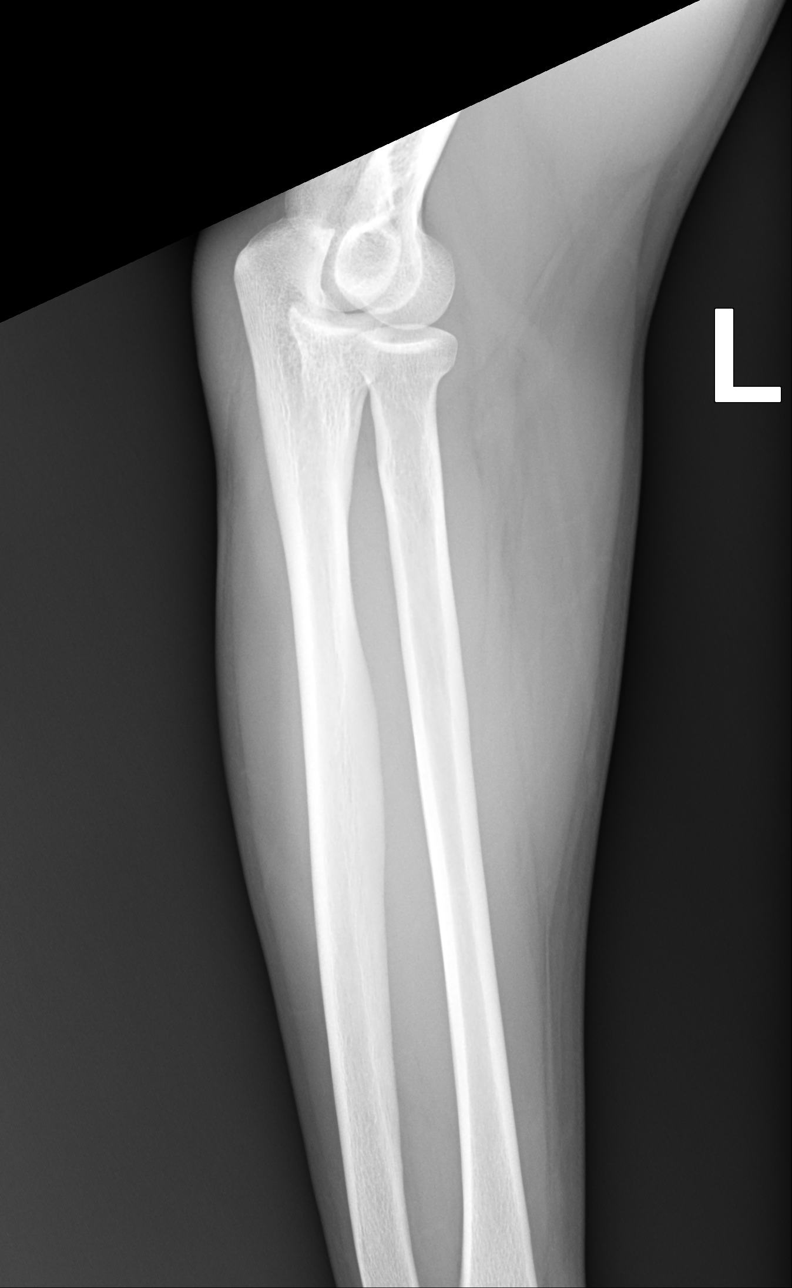

[elbow lat]
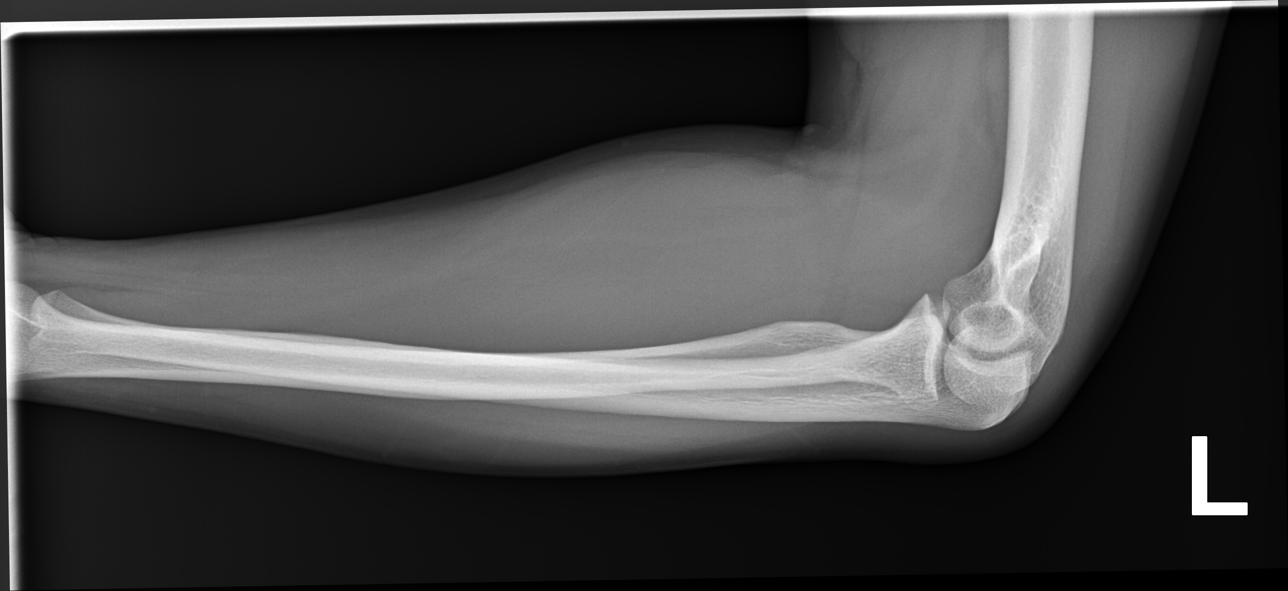

[4 of 4 positions shown; findings below may reference images not displayed]

FINDINGS: There is no evidence of acute fracture, subluxation or dislocation.

There is no evidence of joint effusion.

Mild posterior soft tissue swelling is noted.

No focal bony abnormalities are present.
IMPRESSION: Mild posterior soft tissue swelling which may represent fluid in the
olecranon bursa/bursitis.

No acute bony abnormality or joint effusion.

## 2020-02-25 ENCOUNTER — Ambulatory Visit: Payer: 59 | Admitting: Physician Assistant

## 2020-03-31 ENCOUNTER — Ambulatory Visit: Payer: 59 | Admitting: Family Medicine

## 2020-11-20 ENCOUNTER — Telehealth: Payer: Self-pay | Admitting: Physician Assistant

## 2020-11-20 ENCOUNTER — Encounter: Payer: Self-pay | Admitting: Nurse Practitioner

## 2020-11-20 NOTE — Telephone Encounter (Signed)
Patient's wife called and states patient would like a steroid or something called in to help with Covid and symptoms. [facility], thanks.

## 2020-11-20 NOTE — Telephone Encounter (Signed)
Patient denies SOB, Difficulty breathing. Patient advised to go to [facility] for acute symptoms. Patient referred to antibody infusion clinic. AS, CMA

## 2021-01-08 ENCOUNTER — Other Ambulatory Visit: Payer: Self-pay | Admitting: Physician Assistant

## 2021-01-08 DIAGNOSIS — Z0000 Encounter for general adult medical examination without abnormal findings: Secondary | ICD-10-CM

## 2021-01-16 ENCOUNTER — Other Ambulatory Visit: Payer: 59

## 2021-01-22 ENCOUNTER — Encounter: Payer: 59 | Admitting: Physician Assistant

## 2022-07-26 ENCOUNTER — Other Ambulatory Visit: Payer: Self-pay | Admitting: Cardiology

## 2022-07-26 DIAGNOSIS — R002 Palpitations: Secondary | ICD-10-CM

## 2022-07-26 DIAGNOSIS — R55 Syncope and collapse: Secondary | ICD-10-CM

## 2022-07-27 ENCOUNTER — Ambulatory Visit: Payer: 59 | Attending: Cardiology

## 2022-07-27 DIAGNOSIS — R55 Syncope and collapse: Secondary | ICD-10-CM

## 2022-07-27 DIAGNOSIS — R002 Palpitations: Secondary | ICD-10-CM

## 2022-08-25 ENCOUNTER — Telehealth: Payer: Self-pay | Admitting: Cardiology

## 2022-08-25 ENCOUNTER — Encounter: Payer: Self-pay | Admitting: Physician Assistant

## 2022-08-25 NOTE — Telephone Encounter (Signed)
Results of Monitor routed to Dr. Rayford Halsted office by Reuben Likes

## 2022-08-25 NOTE — Telephone Encounter (Signed)
Patient called wanting to discuss results of heart monitor test.

## 2022-08-25 NOTE — Telephone Encounter (Signed)
Pt advised that his PCP will get the result.

## 2022-08-25 NOTE — Telephone Encounter (Signed)
Patient calling back. He says his PCP has not received the results yet.

## 2022-09-17 DIAGNOSIS — T7840XA Allergy, unspecified, initial encounter: Secondary | ICD-10-CM | POA: Insufficient documentation

## 2022-09-17 DIAGNOSIS — M1990 Unspecified osteoarthritis, unspecified site: Secondary | ICD-10-CM | POA: Insufficient documentation

## 2022-09-17 DIAGNOSIS — K219 Gastro-esophageal reflux disease without esophagitis: Secondary | ICD-10-CM | POA: Insufficient documentation

## 2022-09-19 NOTE — Progress Notes (Unsigned)
Cardiology Office Note:    Date:  09/20/2022   ID:  Steven Park, DOB Jul 18, 1991, MRN [id number]  PCP:  Enid Skeens., MD  Cardiologist:  Shirlee More, MD   Referring MD: Enid Skeens., MD  ASSESSMENT:    1. Frequent PVCs   2. Vertigo    PLAN:    In order of problems listed above:  I think his clinical presentation is much more consistent with inner ear with vertigo tinnitus some loss of hearing and I do not think the PVCs seen on the monitor are the cause of his clinical complaint. PVCs are not high in number but are symptomatic and he has at least one 4 beat run.  He will avoid over-the-counter proarrhythmic drugs I encouraged him to get a smart watch to monitor his heart rhythm and capture symptomatic episodes, [employer] and echocardiogram to screen for underlying cardiomyopathy.  If it is normal at this time I do no further evaluation.  Next appointment as needed if his echocardiogram is reassuring   Medication Adjustments/Labs and Tests Ordered: Current medicines are reviewed at length with the patient today.  Concerns regarding medicines are outlined above.  No orders of the defined types were placed in this encounter.  No orders of the defined types were placed in this encounter.    Chief Complaint  Patient presents with   PVC    History of Present Illness:    Steven Park is a 31 y.o. male who is being seen today for the evaluation of abnormal event monitor at the request of [place], Marshall Cork., MD.  He was seen with his primary care physician in August for episodes of lightheadedness.  CBC TSH were normal CMP with abnormal transaminases.  EKG difficult to visualize in the fax records but looks grossly normal independently reviewed by me.  He wore a ZIO monitor which is recorded as showing short episodes of PVCs/ventricular tachycardia.  I independently reviewed the event monitor initiated 07/27/2022.  Symptomatic episodes were all sinus  rhythm and typically associated with PVCs and a time APCs.  Overall frequency of both ventricular and supraventricular arrhythmia were normal.  There were 3 episodes noted as ventricular tachycardia.  The first is clearly supraventricular tachycardia initiated by an APC with aberrant conduction.  The second is accelerated idioventricular rhythm brief at 100 bpm the third is a 4 beat run of ventricular premature contractions.   His story begins with episodes where he is unsteady as vertigo and can last 5 or 10 minutes and it forces him to stop and rest.  He has not lost consciousness.  He has decreased hearing and he also has tinnitus. Occasionally he is aware of his heart beating skipped beat Previously he had alcohol usage more than moderate, he takes no proarrhythmic drug Family history is noteworthy for his mother with atrial fibrillation, no family history of sudden death or cardiomyopathy He is not having chest pain shortness of breath edema or syncope Past Medical History:  Diagnosis Date   Allergy    Arthritis    "lower back down   Asthma    GERD (gastroesophageal reflux disease)     Past Surgical History:  Procedure Laterality Date   COLONOSCOPY     NO PAST SURGERIES      Current Medications: Current Meds  Medication Sig   cetirizine (ZYRTEC) 10 MG tablet Take 10 mg by mouth daily as needed for allergies.   testosterone cypionate (DEPOTESTOSTERONE CYPIONATE) 200 MG/ML injection Inject  into the skin every 14 (fourteen) days.     Allergies:   Citalopram and Aspirin   Social History   Socioeconomic History   Marital status: Married    Spouse name: Not on file   Number of children: 2   Years of education: Not on file   Highest education level: Not on file  Occupational History   Occupation: [place] [employer] ([employer])  Tobacco Use   Smoking status: Never   Smokeless tobacco: Current    Types: Chew  Vaping Use   Vaping Use: Never used  Substance and Sexual  Activity   Alcohol use: Yes    Alcohol/week: 6.0 standard drinks of alcohol    Types: 6 Cans of beer per week   Drug use: No   Sexual activity: Yes    Birth control/protection: Pill  Other Topics Concern   Not on file  Social History Narrative   Not on file   Social Determinants of Health   Financial Resource Strain: Not on file  Food Insecurity: Not on file  Transportation Needs: Not on file  Physical Activity: Not on file  Stress: Not on file  Social Connections: Not on file     Family History: The patient's family history includes Arthritis in his mother; Atrial fibrillation in his mother; Diabetes in his mother; Hypertension in his maternal grandfather; Other in his father.  ROS:   ROS Please see the history of present illness.     All other systems reviewed and are negative.  EKGs/Labs/Other Studies Reviewed:    The following studies were reviewed today:   EKG:  EKG is  ordered today.  The ekg ordered today is personally reviewed and demonstrates sinus rhythm normal no abnormality of QRS duration or QT interval  Recent Labs: No results found for requested labs within last 365 days.  Recent Lipid Panel    Component Value Date/Time   CHOL 137 10/19/2018 0858   TRIG 58 10/19/2018 0858   HDL 48 10/19/2018 0858   CHOLHDL 2.9 10/19/2018 0858   LDLCALC 77 10/19/2018 0858   LDLDIRECT 86 01/30/2020 1455    Physical Exam:    VS:  BP 134/84 (BP Location: Right Arm, Patient Position: Sitting)   Pulse 90   Ht 6\' 2"  (1.88 m)   Wt 220 lb 12.8 oz (100.2 kg)   SpO2 98%   BMI 28.35 kg/m     Wt Readings from Last 3 Encounters:  09/20/22 220 lb 12.8 oz (100.2 kg)  01/30/20 218 lb 6.4 oz (99.1 kg)  06/11/19 200 lb 12.8 oz (91.1 kg)     GEN: Looks healthy well nourished, well developed in no acute distress HEENT: Normal NECK: No JVD; No carotid bruits LYMPHATICS: No lymphadenopathy CARDIAC: RRR, no murmurs, rubs, gallops RESPIRATORY:  Clear to auscultation without  rales, wheezing or rhonchi  ABDOMEN: Soft, non-tender, non-distended MUSCULOSKELETAL:  No edema; No deformity  SKIN: Warm and dry NEUROLOGIC:  Alert and oriented x 3 PSYCHIATRIC:  Normal affect     Signed, Shirlee More, MD  09/20/2022 10:01 AM    [place]

## 2022-09-20 ENCOUNTER — Ambulatory Visit: Payer: 59 | Attending: Cardiology | Admitting: Cardiology

## 2022-09-20 ENCOUNTER — Encounter: Payer: Self-pay | Admitting: Cardiology

## 2022-09-20 VITALS — BP 134/84 | HR 90 | Ht 74.0 in | Wt 220.8 lb

## 2022-09-20 DIAGNOSIS — R42 Dizziness and giddiness: Secondary | ICD-10-CM

## 2022-09-20 DIAGNOSIS — I493 Ventricular premature depolarization: Secondary | ICD-10-CM | POA: Diagnosis not present

## 2022-09-20 NOTE — Patient Instructions (Signed)
Medication Instructions:  Your physician recommends that you continue on your current medications as directed. Please refer to the Current Medication list given to you today.  *If you need a refill on your cardiac medications before your next appointment, please call your pharmacy*   Lab Work: NONE If you have labs (blood work) drawn today and your tests are completely normal, you will receive your results only by: [place] (if you have MyChart) OR A paper copy in the mail If you have any lab test that is abnormal or we need to change your treatment, we will call you to review the results.   Testing/Procedures: Your physician has requested that you have an echocardiogram. Echocardiography is a painless test that uses sound waves to create images of your heart. It provides your doctor with information about the size and shape of your heart and how well your heart's chambers and valves are working. This procedure takes approximately one hour. There are no restrictions for this procedure. Please do NOT wear cologne, perfume, aftershave, or lotions (deodorant is allowed). Please arrive 15 minutes prior to your appointment time.    Follow-Up: At [facility], you and your health needs are our priority.  As part of our continuing mission to provide you with exceptional heart care, we have created designated Provider Care Teams.  These Care Teams include your primary Cardiologist (physician) and Advanced Practice Providers (APPs -  Physician Assistants and Nurse Practitioners) who all work together to provide you with the care you need, when you need it.  We recommend signing up for the patient portal called "MyChart".  Sign up information is provided on this After Visit Summary.  MyChart is used to connect with patients for Virtual Visits (Telemedicine).  Patients are able to view lab/test results, encounter notes, upcoming appointments, etc.  Non-urgent messages can be sent to your  provider as well.   To learn more about what you can do with MyChart, go to [email or web address].    Your next appointment:  As Needed    The format for your next appointment:   In Person  Provider:   Shirlee More, MD    Other Instructions Get a [place]

## 2022-10-07 ENCOUNTER — Ambulatory Visit: Payer: 59 | Attending: Cardiology

## 2022-10-07 DIAGNOSIS — R42 Dizziness and giddiness: Secondary | ICD-10-CM | POA: Diagnosis not present

## 2022-10-07 DIAGNOSIS — I493 Ventricular premature depolarization: Secondary | ICD-10-CM | POA: Diagnosis not present

## 2022-10-07 LAB — ECHOCARDIOGRAM COMPLETE
Area-P 1/2: 2.37 cm2
S' Lateral: 3.4 cm

## 2024-01-20 ENCOUNTER — Telehealth: Payer: Self-pay | Admitting: Cardiology

## 2024-01-20 NOTE — Telephone Encounter (Signed)
Patient's is requesting call back to discuss getting referral to see Dr. Ladona Ridgel. States pt has a friend with similar issues as patient and believes he may need to be seen by an EP doctor. Requesting call back to discuss.

## 2024-01-20 NOTE — Telephone Encounter (Signed)
Called patient and he reported that at times he can feel palpitations in his left upper chest and it feels like his heart is skipping a beat. These events occur with exertion and also at rest and they are also triggered when he eats fatty foods.. During these events he does become short of breath but no other symptoms. His blood pressure yesterday was 140/60 HR 70's.

## 2024-01-23 ENCOUNTER — Other Ambulatory Visit: Payer: Self-pay

## 2024-01-23 DIAGNOSIS — R002 Palpitations: Secondary | ICD-10-CM

## 2024-01-23 DIAGNOSIS — I493 Ventricular premature depolarization: Secondary | ICD-10-CM

## 2024-01-24 ENCOUNTER — Other Ambulatory Visit: Payer: Self-pay

## 2024-01-24 DIAGNOSIS — I493 Ventricular premature depolarization: Secondary | ICD-10-CM

## 2024-01-24 DIAGNOSIS — R002 Palpitations: Secondary | ICD-10-CM

## 2024-01-24 NOTE — Telephone Encounter (Signed)
 Called patient to ask why the zio heart monitor order was canceled and he stated that at this time he did not want to go through wearing a heart monitor at this time. He stated that he could capture his EKG on his watch and he would send it into the office via MyChart so Dr. Dulce Sellar could evaluate it. Patient had no further questions at this time.

## 2025-01-02 NOTE — Progress Notes (Unsigned)
 " Cardiology Office Note:    Date:  01/03/2025   ID:  Steven Park, DOB [birth year], MRN [id number]  PCP:  Sabas Norleen PARAS., MD  Cardiologist:  Redell Leiter, MD    Referring MD: Sabas Norleen PARAS., MD    ASSESSMENT:    1. Frequent PVCs   2. Mitral valve prolapse    PLAN:    In order of problems listed above:  His symptoms seem disproportionately severe reassess 1 week monitor decision about beta-blocker No murmur on exam I do not think we need to repeat his echocardiogram   Next appointment: 1 year unless intervention is performed for high frequency PVCs   Medication Adjustments/Labs and Tests Ordered: Current medicines are reviewed at length with the patient today.  Concerns regarding medicines are outlined above.  Orders Placed This Encounter  Procedures   EKG 12-Lead   No orders of the defined types were placed in this encounter.    History of Present Illness:    [name] is a 34 y.o. male with a hx of mitral valve prolapse and symptomatic PVCs last seen 09/20/2022.  Following the visit an echocardiogram performed showing mitral valve prolapse with trivial regurgitation.  Left ventricle normal size systolic global and regional function diastolic function.  His event monitor showed rare ventricular ectopy but he had 3 brief runs of PVCs the longest 11 complexes rate of 101 bpm and all of his symptomatic events were single PVCs.  Compliance with diet, lifestyle and medications: Yes  His symptoms are best described as intermittent few times a week clustered at times bothersome and the worst episode lasted on and off for about 5 days Does not occur with physical effort no syncope lightheadedness chest pain shortness of breath He does not use over-the-counter proarrhythmic drugs I asked him to allow me to put a monitor on him again to see if we can grasp the frequency and decide whether he would benefit from a beta-blocker Past Medical History:   Diagnosis Date   Adjustment disorder with mixed anxiety and depressed mood 07/22/2015   Airway hyperreactivity 07/22/2015   Allergy    Arthritis    lower back down   Dysuria 09/19/2018   GERD (gastroesophageal reflux disease)    Internal hemorrhoid, bleeding; nonthrombosed 05/30/2018   Left ear pain 10/17/2017   Loose stools 05/30/2018   Migraine 07/22/2015   Pain and swelling of elbow, left 06/11/2019   Rectal bleeding 05/30/2018    Current Medications: Active Medications[1]    EKGs/Labs/Other Studies Reviewed:    The following studies were reviewed today:  Cardiac Studies & Procedures   ______________________________________________________________________________________________     ECHOCARDIOGRAM  ECHOCARDIOGRAM COMPLETE 10/07/2022  Narrative ECHOCARDIOGRAM REPORT    Patient Name:   Steven Park Date of Exam: 10/07/2022 Medical Rec #:  [id number]             Height:       74.0 in Accession #:    [id number]            Weight:       220.8 lb Date of Birth:  04-May-1991             BSA:          2.267 m Patient Age:    34 years              BP:           134/84 mmHg Patient Gender: M  HR:           74 bpm. Exam Location:  [place]  Procedure: 2D Echo, Cardiac Doppler, Color Doppler and Strain Analysis  Indications:    Frequent PVCs [I49.3 (ICD-10-CM)]; Vertigo [email or web address] (ICD-10-CM)]  History:        Patient has no prior history of Echocardiogram examinations. Arrythmias:PVC.  Sonographer:    Charlie Jointer RDCS Referring Phys: [id number] [employer] J Jailine Lieder  IMPRESSIONS   1. GLS -17.6. Left ventricular ejection fraction, by estimation, is 55 to 60%. The left ventricle has normal function. The left ventricle has no regional wall motion abnormalities. Left ventricular diastolic parameters were normal. 2. Right ventricular systolic function is normal. The right ventricular size is normal. 3. The mitral valve is myxomatous. Trivial mitral valve  regurgitation. No evidence of mitral stenosis. There is mild late systolic prolapse of the middle scallop of the posterior leaflet of the mitral valve. 4. The aortic valve is normal in structure. Aortic valve regurgitation is not visualized. No aortic stenosis is present. 5. The inferior vena cava is normal in size with greater than 50% respiratory variability, suggesting right atrial pressure of 3 mmHg.  FINDINGS Left Ventricle: GLS -17.6. Left ventricular ejection fraction, by estimation, is 55 to 60%. The left ventricle has normal function. The left ventricle has no regional wall motion abnormalities. The left ventricular internal cavity size was normal in size. There is no left ventricular hypertrophy. Left ventricular diastolic parameters were normal.  Right Ventricle: The right ventricular size is normal. No increase in right ventricular wall thickness. Right ventricular systolic function is normal.  Left Atrium: Left atrial size was normal in size.  Right Atrium: Right atrial size was normal in size.  Pericardium: There is no evidence of pericardial effusion.  Mitral Valve: The mitral valve is myxomatous. There is mild late systolic prolapse of the middle scallop of the posterior leaflet of the mitral valve. Trivial mitral valve regurgitation. No evidence of mitral valve stenosis.  Tricuspid Valve: The tricuspid valve is normal in structure. Tricuspid valve regurgitation is trivial. No evidence of tricuspid stenosis.  Aortic Valve: The aortic valve is normal in structure. Aortic valve regurgitation is not visualized. No aortic stenosis is present.  Pulmonic Valve: The pulmonic valve was normal in structure. Pulmonic valve regurgitation is not visualized. No evidence of pulmonic stenosis.  Aorta: The aortic root is normal in size and structure.  Venous: The inferior vena cava is normal in size with greater than 50% respiratory variability, suggesting right atrial pressure of 3  mmHg.  IAS/Shunts: No atrial level shunt detected by color flow Doppler.   LEFT VENTRICLE PLAX 2D LVIDd:         4.80 cm   Diastology LVIDs:         3.40 cm   LV e' medial:    9.25 cm/s LV PW:         1.10 cm   LV E/e' medial:  7.2 LV IVS:        0.90 cm   LV e' lateral:   13.60 cm/s LVOT diam:     2.20 cm   LV E/e' lateral: 4.9 LV SV:         90 LV SV Index:   40 LVOT Area:     3.80 cm   RIGHT VENTRICLE             IVC RV Basal diam:  3.70 cm     IVC diam: 1.90 cm RV Mid diam:  2.80 cm RV S prime:     15.90 cm/s TAPSE (M-mode): 3.0 cm  LEFT ATRIUM             Index        RIGHT ATRIUM           Index LA diam:        3.30 cm 1.46 cm/m   RA Area:     21.90 cm LA Vol (A2C):   53.1 ml 23.43 ml/m  RA Volume:   69.60 ml  30.70 ml/m LA Vol (A4C):   22.8 ml 10.06 ml/m LA Biplane Vol: 38.1 ml 16.81 ml/m AORTIC VALVE LVOT Vmax:   135.00 cm/s LVOT Vmean:  89.100 cm/s LVOT VTI:    0.238 m  AORTA Ao Root diam: 3.00 cm Ao Asc diam:  2.65 cm Ao Desc diam: 2.00 cm  MITRAL VALVE MV Area (PHT): 2.37 cm    SHUNTS MV Decel Time: 320 msec    Systemic VTI:  0.24 m MV E velocity: 66.50 cm/s  Systemic Diam: 2.20 cm MV A velocity: 50.00 cm/s MV E/A ratio:  1.33  Lamar Fitch MD Electronically signed by Lamar Fitch MD Signature Date/Time: 10/07/2022/5:13:55 PM    Final    MONITORS  LONG TERM MONITOR (3-14 DAYS) 08/16/2022  Narrative Patch Wear Time:  11 days and 2 hours (2023-08-29T08:08:15-0400 to 2023-09-09T10:56:41-0400)  Patient had a min HR of 40 bpm, max HR of 179 bpm, and avg HR of 76 bpm.  Predominant underlying rhythm was Sinus Rhythm.  1 run of Supraventricular Tachycardia occurred lasting 6 beats with a max rate of 128 bpm (avg 116 bpm). Isolated SVEs were rare (<1.0%), SVE Couplets were rare (<1.0%), and SVE Triplets were rare (<1.0%).  Isolated VEs were rare (<1.0%), VE Couplets were rare (<1.0%), and no VE Triplets were present. Ventricular  Bigeminy and Trigeminy were present. 3 Ventricular Tachycardia runs occurred, the run with the fastest interval lasting 5 beats with a max rate of 176 bpm, the longest lasting 11 beats with an avg rate of 101 bpm.       ______________________________________________________________________________________________      EKG Interpretation Date/Time:  Thursday January 03 2025 09:15:31 EST Ventricular Rate:  76 PR Interval:  160 QRS Duration:  98 QT Interval:  376 QTC Calculation: 423 R Axis:   86  Text Interpretation: Normal sinus rhythm Minimal voltage criteria for LVH, may be normal variant ( Sokolow-Lyon ) No previous ECGs available Confirmed by Monetta Rogue ([id number]) on 01/03/2025 9:20:31 AM   Recent Labs: No results found for requested labs within last 365 days.  Recent Lipid Panel    Component Value Date/Time   CHOL 137 10/19/2018 0858   TRIG 58 10/19/2018 0858   HDL 48 10/19/2018 0858   CHOLHDL 2.9 10/19/2018 0858   LDLCALC 77 10/19/2018 0858   LDLDIRECT 86 01/30/2020 1455    Physical Exam:    VS:  BP 124/88   Pulse 76   Ht 6' 2 (1.88 m)   Wt 209 lb 6 oz (95 kg)   SpO2 98%   BMI 26.88 kg/m     Wt Readings from Last 3 Encounters:  01/03/25 209 lb 6 oz (95 kg)  09/20/22 220 lb 12.8 oz (100.2 kg)  01/30/20 218 lb 6.4 oz (99.1 kg)     GEN:  Well nourished, well developed in no acute distress HEENT: Normal NECK: No JVD; No carotid bruits LYMPHATICS: No lymphadenopathy CARDIAC: RRR, no murmurs, rubs, gallops RESPIRATORY:  Clear to auscultation without rales, wheezing or rhonchi  ABDOMEN: Soft, non-tender, non-distended MUSCULOSKELETAL:  No edema; No deformity  SKIN: Warm and dry NEUROLOGIC:  Alert and oriented x 3 PSYCHIATRIC:  Normal affect    Signed, Redell Leiter, MD  01/03/2025 9:21 AM    [facility]      [1]  Current Meds  Medication Sig   acetaminophen (TYLENOL) 325 MG tablet Take 325 mg by mouth as needed.   cetirizine  (ZYRTEC) 10 MG tablet Take 10 mg by mouth daily as needed for allergies.   testosterone cypionate (DEPOTESTOSTERONE CYPIONATE) 200 MG/ML injection Inject into the skin every 14 (fourteen) days.   "

## 2025-01-03 ENCOUNTER — Ambulatory Visit

## 2025-01-03 ENCOUNTER — Ambulatory Visit: Admitting: Cardiology

## 2025-01-03 VITALS — BP 124/88 | HR 76 | Ht 74.0 in | Wt 209.4 lb

## 2025-01-03 DIAGNOSIS — I341 Nonrheumatic mitral (valve) prolapse: Secondary | ICD-10-CM

## 2025-01-03 DIAGNOSIS — I493 Ventricular premature depolarization: Secondary | ICD-10-CM

## 2025-01-03 NOTE — Patient Instructions (Addendum)
 Medication Instructions:  Your physician recommends that you continue on your current medications as directed. Please refer to the Current Medication list given to you today.  *If you need a refill on your cardiac medications before your next appointment, please call your pharmacy*  Lab Work: None If you have labs (blood work) drawn today and your tests are completely normal, you will receive your results only by: MyChart Message (if you have MyChart) OR A paper copy in the mail If you have any lab test that is abnormal or we need to change your treatment, we will call you to review the results.  Testing/Procedures: A zio monitor was ordered today. It will remain on for 7 days. You will then return monitor and event diary in provided box. It takes 1-2 weeks for report to be downloaded and returned to us . We will call you with the results. If monitor falls off or has orange flashing light, please call Zio for further instructions.    Follow-Up: At [facility], you and your health needs are our priority.  As part of our continuing mission to provide you with exceptional heart care, our providers are all part of one team.  This team includes your primary Cardiologist (physician) and Advanced Practice Providers or APPs (Physician Assistants and Nurse Practitioners) who all work together to provide you with the care you need, when you need it.  Your next appointment:   1 year(s)  Provider:   Redell Leiter, MD    We recommend signing up for the patient portal called MyChart.  Sign up information is provided on this After Visit Summary.  MyChart is used to connect with patients for Virtual Visits (Telemedicine).  Patients are able to view lab/test results, encounter notes, upcoming appointments, etc.  Non-urgent messages can be sent to your provider as well.   To learn more about what you can do with MyChart, go to [email or web address].   Other Instructions None              1. Avoid all over-the-counter antihistamines except Claritin/Loratadine and Zyrtec/Cetrizine. 2. Avoid all combination including cold sinus allergies flu decongestant and sleep medications 3. You can use Robitussin DM Mucinex and Mucinex DM for cough. 4. can use Tylenol aspirin ibuprofen and naproxen but no combinations such as sleep or sinus.

## 2025-01-03 NOTE — Addendum Note (Signed)
 Addended by: SHERRE ADE I on: 01/03/2025 10:01 AM   Modules accepted: Orders
